# Patient Record
Sex: Female | Born: 1965
Health system: Southern US, Community
[De-identification: ages and names within clinical notes are randomized; demographics above are authoritative.]

## PROBLEM LIST (undated history)

## (undated) DIAGNOSIS — F909 Attention-deficit hyperactivity disorder, unspecified type: Secondary | ICD-10-CM

## (undated) DIAGNOSIS — K219 Gastro-esophageal reflux disease without esophagitis: Secondary | ICD-10-CM

## (undated) DIAGNOSIS — I10 Essential (primary) hypertension: Secondary | ICD-10-CM

## (undated) DIAGNOSIS — T7840XA Allergy, unspecified, initial encounter: Secondary | ICD-10-CM

## (undated) DIAGNOSIS — R232 Flushing: Secondary | ICD-10-CM

## (undated) DIAGNOSIS — E782 Mixed hyperlipidemia: Secondary | ICD-10-CM

## (undated) DIAGNOSIS — F419 Anxiety disorder, unspecified: Secondary | ICD-10-CM

## (undated) HISTORY — PX: BREAST ENHANCEMENT SURGERY: SHX7

## (undated) HISTORY — DX: Mixed hyperlipidemia: E78.2

## (undated) HISTORY — DX: Gastro-esophageal reflux disease without esophagitis: K21.9

## (undated) HISTORY — DX: Anxiety disorder, unspecified: F41.9

## (undated) HISTORY — DX: Essential (primary) hypertension: I10

## (undated) HISTORY — DX: Flushing: R23.2

## (undated) HISTORY — PX: COSMETIC SURGERY: SHX468

## (undated) HISTORY — DX: Allergy, unspecified, initial encounter: T78.40XA

## (undated) HISTORY — DX: Attention-deficit hyperactivity disorder, unspecified type: F90.9

---

## 1995-06-18 HISTORY — PX: AUGMENTATION MAMMAPLASTY: SUR837

## 2010-07-04 ENCOUNTER — Ambulatory Visit: Payer: Self-pay | Admitting: Oncology

## 2010-07-18 ENCOUNTER — Ambulatory Visit: Payer: Self-pay | Admitting: Oncology

## 2010-09-04 ENCOUNTER — Ambulatory Visit: Payer: Self-pay | Admitting: Oncology

## 2010-09-16 ENCOUNTER — Ambulatory Visit: Payer: Self-pay | Admitting: Oncology

## 2015-06-27 LAB — HM PAP SMEAR: HM Pap smear: NEGATIVE

## 2016-01-23 ENCOUNTER — Encounter: Payer: Self-pay | Admitting: Obstetrics and Gynecology

## 2016-01-23 ENCOUNTER — Ambulatory Visit (INDEPENDENT_AMBULATORY_CARE_PROVIDER_SITE_OTHER): Payer: BLUE CROSS/BLUE SHIELD | Admitting: Obstetrics and Gynecology

## 2016-01-23 VITALS — BP 142/87 | HR 76 | Ht 65.0 in | Wt 177.7 lb

## 2016-01-23 DIAGNOSIS — N951 Menopausal and female climacteric states: Secondary | ICD-10-CM | POA: Diagnosis not present

## 2016-01-23 DIAGNOSIS — R635 Abnormal weight gain: Secondary | ICD-10-CM | POA: Diagnosis not present

## 2016-01-23 NOTE — Progress Notes (Signed)
GYNECOLOGY CLINIC PROGRESS NOTE  Subjective:    Kathleen Dennis is a 50 y.o. G1P0010 postmenopausal female (x 1 year) who presents to discuss management options for vasomotor symptoms. Patient is requesting possible hormone replacement therapy due to hot flashes, moodiness, weight gain (30 lbs in the past year) and no energy. The patient is not taking hormone replacement therapy. Patient denies post-menopausal vaginal bleeding. The patient is sexually active. GYN screening history: last pap: approximate date 06/2015 and was normal. Patient is hormone deficient due to menopause, which occurred at age 89.    Current hormone therapy:  none  Previous hormone therapy:  None. Has tried OTC supplements such as Estroven, black cohosh,  weight management pills, Cool Sleep with only minimal to modest results.   Menstrual History: OB History    Gravida Para Term Preterm AB Living   1       1     SAB TAB Ectopic Multiple Live Births   1              Menarche age: 53 Patient's last menstrual period was 06/17/2013 (approximate).    Past Medical History:  Diagnosis Date  . Anxiety   . Hypertension     Family History  Problem Relation Age of Onset  . Thyroid disease Mother   . Diabetes Father   . Hypertension Father   . Heart disease Maternal Grandmother   . Heart disease Maternal Grandfather     Past Surgical History:  Procedure Laterality Date  . BREAST ENHANCEMENT SURGERY      Social History   Social History  . Marital status: Single    Spouse name: N/A  . Number of children: N/A  . Years of education: N/A   Occupational History  . Not on file.   Social History Main Topics  . Smoking status: Light Tobacco Smoker    Years: 5.00    Types: Cigarettes  . Smokeless tobacco: Never Used  . Alcohol use Yes     Comment: socially  . Drug use:   . Sexual activity: Yes    Birth control/ protection: Post-menopausal   Other Topics Concern  . Not on file   Social  History Narrative  . No narrative on file    No current outpatient prescriptions on file prior to visit.   No current facility-administered medications on file prior to visit.     Allergies  Allergen Reactions  . Sulfa Antibiotics Hives     Review of Systems Pertinent items noted in HPI and remainder of comprehensive ROS otherwise negative.    Objective:     BP (!) 142/87   Pulse 76   Ht  (1.651 m)   Wt 177 lb 11.2 oz (80.6 kg)   LMP 06/17/2013 (Approximate)   BMI 29.57 kg/m  General appearance: alert and no distress Neck: no adenopathy, no carotid bruit, no JVD, supple, symmetrical, trachea midline and thyroid not enlarged, symmetric, no tenderness/mass/nodules Lungs: clear to auscultation bilaterally Heart: regular rate and rhythm, S1, S2 normal, no murmur, click, rub or gallop Abdomen: soft, non-tender; bowel sounds normal; no masses,  no organomegaly Pelvic: deferred  Extremities: extremities normal, atraumatic, no cyanosis or edema   Assessment:   Menopausal syndrome in 51 y.o. woman.   Weight gain   Plan:   - Patient with bothersome menopausal vasomotor symptoms. Discussed lifestyle interventions such as wearing light clothing, remaining in cool environments, having fan/air conditioner in the room, avoiding hot beverages etc.  Discussed using hormone therapy and concerns about increased risk of heart disease, cerebrovascular disease, thromboembolic disease,  and breast cancer.  Also discussed other medical options such as Brisdelle/Paxil, Effexor or Neurontin.  Patient opted for non-hormonal therapy with Brisdelle. Samples given (2 week supply).  Patient to f/u in office after samples completed if prescription desired, or to discuss alternative treatments agin.    - Patient desires to discuss weight management.  Notes that she is exercising and trying to modify diet with no significant results. Inquires about prescription weight loss medications.  [facility].     Hildred LaserAnika Nichelle Renwick, MD [facility]

## 2016-01-23 NOTE — Patient Instructions (Signed)
Paroxetine capsules What is this medicine? PAROXETINE (pa ROX e teen) is used to treat hot flashes due to menopause. This medicine may be used for other purposes; ask your health care provider or pharmacist if you have questions. What should I tell my health care provider before I take this medicine? They need to know if you have any of these conditions: -bleeding disorders -glaucoma -heart disease -kidney disease -liver disease -low levels of sodium in the blood -mania or bipolar disorder -seizures -suicidal thoughts, plans, or attempt; a previous suicide attempt by you or a family member -take MAOIs like Carbex, Eldepryl, Marplan, Nardil, and Parnate -take medicines that treat or prevent blood clots -an unusual or allergic reaction to paroxetine, other medicines, foods, dyes, or preservatives -pregnant or trying to get pregnant -breast-feeding How should I use this medicine? Take this medicine by mouth once daily at bedtime. Follow the directions on the prescription label. This medicine can be taken with or without food. Take your medicine at regular intervals. Do not take your medicine more often than directed. A special MedGuide will be given to you by the pharmacist with each prescription and refill. Be sure to read this information carefully each time. Overdosage: If you think you have taken too much of this medicine contact a poison control center or emergency room at once. NOTE: This medicine is only for you. Do not share this medicine with others. What if I miss a dose? If you miss a dose, take it as soon as you can. If it is almost time for your next dose, take only that dose. Do not take double or extra doses. What may interact with this medicine? Do not take this medicine with any of the following medications: -linezolid -MAOIs like Carbex, Eldepryl, Marplan, Nardil, and Parnate -methylene blue (injected into a vein) -pimozide -thioridazine This medicine may also  interact with the following medications: -alcohol -aspirin and aspirin-like medicines -atomoxetine -certain medicines for depression, anxiety, or psychotic disturbances -certain medicines for irregular heart beat like propafenone, flecainide, encainide, and quinidine -certain medicines for migraine headache like almotriptan, eletriptan, frovatriptan, naratriptan, rizatriptan, sumatriptan, zolmitriptan -cimetidine -digoxin -diuretics -fentanyl -fosamprenavir -furazolidone -isoniazid -lithium -medicines that treat or prevent blood clots like warfarin, enoxaparin, and dalteparin -medicines for sleep -NSAIDs, medicines for pain and inflammation, like ibuprofen or naproxen -phenobarbital -phenytoin -procarbazine -rasagiline -ritonavir -supplements like St. John's wort, kava kava, valerian -tamoxifen -tramadol -tryptophan This list may not describe all possible interactions. Give your health care provider a list of all the medicines, herbs, non-prescription drugs, or dietary supplements you use. Also tell them if you smoke, drink alcohol, or use illegal drugs. Some items may interact with your medicine. What should I watch for while using this medicine? Tell your doctor or healthcare professional if your symptoms do not start to get better or if they get worse. Visit your doctor or health care professional for regular checks on your progress. Patients and their families should watch out for new or worsening thoughts of suicide or depression. Also watch out for sudden changes in feelings such as feeling anxious, agitated, panicky, irritable, hostile, aggressive, impulsive, severely restless, overly excited and hyperactive, or not being able to sleep. If this happens, especially at the beginning of treatment or after a change in dose, call your health care professional. You may get drowsy or dizzy. Do not drive, use machinery, or do anything that needs mental alertness until you know how this  medicine affects you. Do not stand or sit up   quickly, especially if you are an older patient. This reduces the risk of dizzy or fainting spells. Alcohol may interfere with the effect of this medicine. Avoid alcoholic drinks. Your mouth may get dry. Chewing sugarless gum, sucking hard candy and drinking plenty of water will help. Contact your doctor if the problem does not go away or is severe. Women should inform their doctor if they wish to become pregnant or think they might be pregnant. There is a potential for serious side effects to an unborn child. Talk to your health care professional or pharmacist for more information. Do not become pregnant while taking this medicine. What side effects may I notice from receiving this medicine? Side effects that you should report to your doctor or health care professional as soon as possible: -allergic reactions like skin rash, itching or hives, swelling of the face, lips, or tongue -changes in emotions or moods -confusion -depression -feeling faint or lightheaded, falls -seizures -suicidal thoughts or actions -unusual bleeding or bruising -unusually weak or tired -weakness Side effects that usually do not require medical attention (Report these to your doctor or health care professional if they continue or are bothersome.): -change in sex drive or performance -fatigue -drowsiness -headache -insomnia -nausea/vomiting -upset stomach This list may not describe all possible side effects. Call your doctor for medical advice about side effects. You may report side effects to FDA at 1-800-FDA-1088. Where should I keep my medicine? Keep out of the reach of children. Store at room temperature between 20 and 25 degrees C (68 and 77 degrees F). Throw away any unused medicine after the expiration date. NOTE: This sheet is a summary. It may not cover all possible information. If you have questions about this medicine, talk to your doctor, pharmacist, or  health care provider.    2016, Elsevier/Gold Standard. (2012-02-03 12:26:17)  

## 2016-02-08 ENCOUNTER — Encounter: Payer: Self-pay | Admitting: Obstetrics and Gynecology

## 2016-02-08 ENCOUNTER — Ambulatory Visit (INDEPENDENT_AMBULATORY_CARE_PROVIDER_SITE_OTHER): Payer: BLUE CROSS/BLUE SHIELD | Admitting: Obstetrics and Gynecology

## 2016-02-08 VITALS — BP 158/84 | HR 86 | Wt 179.2 lb

## 2016-02-08 DIAGNOSIS — I10 Essential (primary) hypertension: Secondary | ICD-10-CM | POA: Diagnosis not present

## 2016-02-08 DIAGNOSIS — N951 Menopausal and female climacteric states: Secondary | ICD-10-CM | POA: Diagnosis not present

## 2016-02-08 MED ORDER — PAROXETINE HCL 10 MG PO TABS: 10.0000 mg | ORAL_TABLET | Freq: Every day | ORAL | 11 refills | Status: DC

## 2016-02-11 NOTE — Progress Notes (Signed)
    GYNECOLOGY PROGRESS NOTE  Subjective:    Patient ID: Kathleen Dennis, female    DOB: 08/21/1965, 50 y.o.   MRN: [id number]  HPI  Patient is a 50 y.o. 91P0010 female who presents for 2 week f/u of menopausal vasomotor symptoms.  Patient took samples of Brisdelle, noted that most of her symptoms improved significantly.  Still noting occasional mood changes and hot flushes, but overall pleased with medication.  Wonders if dose can be increased.   The following portions of the patient's history were reviewed and updated as appropriate: allergies, current medications, past family history, past medical history, past social history, past surgical history and problem list.  Review of Systems Pertinent items noted in HPI and remainder of comprehensive ROS otherwise negative.   Objective:   Blood pressure (!) 158/84, pulse 86, weight 179 lb 3.2 oz (81.3 kg), last menstrual period 06/17/2013. General appearance: alert and no distress Exam deferred.    Assessment:   Menopausal vasomotor symptoms HTN (uncontrolled)  Plan:   - Menopausal symptoms - patient noted that Brisdelle did help but wonders if dose could be increased slightly. Will prescribe Paxil 10 mg, and can titrate up as needed.  - HTN - Patient notes that BPs are usually not this high.  Advised on taking BPs at home and recording values.  Also notes that she has an appointment with Cardiology (scheduled by PCP).   - To f/u as needed.   Hildred LaserAnika Deagen Krass, MD [facility]

## 2016-02-22 ENCOUNTER — Ambulatory Visit: Payer: BLUE CROSS/BLUE SHIELD | Admitting: Obstetrics and Gynecology

## 2016-03-01 ENCOUNTER — Encounter: Payer: Self-pay | Admitting: Obstetrics and Gynecology

## 2016-03-01 ENCOUNTER — Ambulatory Visit (INDEPENDENT_AMBULATORY_CARE_PROVIDER_SITE_OTHER): Payer: BLUE CROSS/BLUE SHIELD | Admitting: Obstetrics and Gynecology

## 2016-03-01 VITALS — BP 138/84 | HR 77 | Wt 177.3 lb

## 2016-03-01 DIAGNOSIS — E669 Obesity, unspecified: Secondary | ICD-10-CM

## 2016-03-01 MED ORDER — CYANOCOBALAMIN 1000 MCG/ML IJ SOLN: 1000.0000 ug | Freq: Once | INTRAMUSCULAR | Status: DC

## 2016-03-01 MED ORDER — PHENTERMINE HCL 37.5 MG PO TABS: 37.5000 mg | ORAL_TABLET | Freq: Every day | ORAL | 2 refills | Status: DC

## 2016-03-01 MED ORDER — CYANOCOBALAMIN 1000 MCG/ML IJ SOLN: 1000.0000 ug | INTRAMUSCULAR | Status: DC

## 2016-03-01 NOTE — Progress Notes (Signed)
Pt is here for weight loss program, is exercising and really trying to lose weight.  She desires to start Phentermine 37.5mg , discussed with pt side effect and how to take medication

## 2016-03-27 ENCOUNTER — Telehealth: Payer: Self-pay | Admitting: Obstetrics and Gynecology

## 2016-03-27 NOTE — Telephone Encounter (Signed)
Please have pt scheduled for a nurse visit for UTI or Dr.Cherry's schedule if she has an availability. Thanks

## 2016-03-27 NOTE — Telephone Encounter (Signed)
PT CALLED AND SHE IS PRETTY SURE THAT SHE HAS A UTI, URGENCY TO URINATE BUT NOTHING COME, BURNING, SHE DID SEE SOME BLOOD IN URINE YESTERDAY, WORSE YESTERDAY, DID GET OTC YESTERDAY TO TRY AND HELP BUT SHE IS AFRAID THAT ITS NOT GOING TO KNOCK IT OUT SINCE SHE SAW THE BLOOD IN URINE.

## 2016-03-27 NOTE — Telephone Encounter (Signed)
PT COMING IN TOMORROW MORNING TO DROP OFF URINE SAMPLE, I PUT HER ON THE LAB SCHEDULE

## 2016-03-28 ENCOUNTER — Other Ambulatory Visit (INDEPENDENT_AMBULATORY_CARE_PROVIDER_SITE_OTHER): Payer: BLUE CROSS/BLUE SHIELD

## 2016-03-28 ENCOUNTER — Telehealth: Payer: Self-pay | Admitting: Obstetrics and Gynecology

## 2016-03-28 DIAGNOSIS — R399 Unspecified symptoms and signs involving the genitourinary system: Secondary | ICD-10-CM | POA: Diagnosis not present

## 2016-03-28 LAB — POCT URINALYSIS DIPSTICK
BILIRUBIN UA: NEGATIVE
Glucose, UA: NEGATIVE
Ketones, UA: NEGATIVE
Leukocytes, UA: NEGATIVE
NITRITE UA: NEGATIVE
PH UA: 6.5
PROTEIN UA: NEGATIVE
RBC UA: NEGATIVE
Spec Grav, UA: 1.025
UROBILINOGEN UA: NEGATIVE

## 2016-03-28 NOTE — Telephone Encounter (Signed)
PT CALLED AND WANTED TO KNOW BY ANY CHANCE IF SOMETHING SHOWED UP ON THE DIPSTICK FROM THIS MORNING WHEN SHE DROPPED HER URINE OFF, I DID TELL HER THAT ALL URINE IS SENT OUT FOR CULTURE BUT I WOULD ASK YOU IF HER URINE WAS DIPPED.

## 2016-03-28 NOTE — Progress Notes (Signed)
Patient ID: Kathleen Dennis, female   DOB: 12-May-1966, 50 y.o.   MRN: [id number]  Urinalysis with in normal limits. Dr. Valentino Saxonherry notified. Will send urine culture before ordering any medication.

## 2016-03-28 NOTE — Telephone Encounter (Signed)
Left message to check my chart.

## 2016-03-29 ENCOUNTER — Ambulatory Visit: Payer: BLUE CROSS/BLUE SHIELD

## 2016-03-29 ENCOUNTER — Ambulatory Visit (INDEPENDENT_AMBULATORY_CARE_PROVIDER_SITE_OTHER): Payer: BLUE CROSS/BLUE SHIELD | Admitting: Obstetrics and Gynecology

## 2016-03-29 VITALS — BP 130/97 | HR 82 | Wt 175.6 lb

## 2016-03-29 DIAGNOSIS — R635 Abnormal weight gain: Secondary | ICD-10-CM | POA: Diagnosis not present

## 2016-03-29 DIAGNOSIS — R399 Unspecified symptoms and signs involving the genitourinary system: Secondary | ICD-10-CM | POA: Diagnosis not present

## 2016-03-29 MED ORDER — CYANOCOBALAMIN 1000 MCG/ML IJ SOLN
1000.0000 ug | Freq: Once | INTRAMUSCULAR | Status: AC
  Administered 2016-03-29: 1000 ug via INTRAMUSCULAR

## 2016-03-29 MED ORDER — CYANOCOBALAMIN 1000 MCG/ML IJ SOLN: 1000.0000 ug | Freq: Once | INTRAMUSCULAR | 1 refills | Status: DC

## 2016-03-29 NOTE — Progress Notes (Signed)
Patient ID: Kathleen Dennis, female   DOB: 1966-02-27, 50 y.o.   MRN: [id number]  03/29/2016  Pt presents for weight, B/P, B-12 injection. No side effects of medication-Phentermine, or B-12.  Weight loss of _2_ lbs. Encouraged eating healthy and exercise.

## 2016-04-01 ENCOUNTER — Other Ambulatory Visit: Payer: Self-pay | Admitting: Obstetrics and Gynecology

## 2016-04-01 LAB — URINE CULTURE

## 2016-04-01 MED ORDER — CIPROFLOXACIN HCL 500 MG PO TABS: 500.0000 mg | ORAL_TABLET | Freq: Two times a day (BID) | ORAL | 0 refills | Status: DC

## 2016-04-11 ENCOUNTER — Telehealth: Payer: Self-pay

## 2016-04-11 NOTE — Telephone Encounter (Signed)
I called pt due to receiving email that pt had not check her message from 03/28/16 regarding her urinalysis results and when I checked her labs for her [facility] results they were positive for ecoli and ATB was sent to pharmacy by Dr. Valentino Saxonherry but pt had not seen this either. Pt states that she does not check her my chart but I encouraged her strongly to do so in the future. She states she went to the pharmacy for another prescription and the ATB was there and she get it and took it. She said she would have definitely have called if she did not get better. Pt appreciative of call.

## 2016-04-26 ENCOUNTER — Ambulatory Visit (INDEPENDENT_AMBULATORY_CARE_PROVIDER_SITE_OTHER): Payer: BLUE CROSS/BLUE SHIELD | Admitting: Obstetrics and Gynecology

## 2016-04-26 VITALS — BP 139/96 | HR 86 | Ht 65.0 in | Wt 174.5 lb

## 2016-04-26 DIAGNOSIS — E669 Obesity, unspecified: Secondary | ICD-10-CM | POA: Diagnosis not present

## 2016-04-26 MED ORDER — CYANOCOBALAMIN 1000 MCG/ML IJ SOLN
1000.0000 ug | Freq: Once | INTRAMUSCULAR | Status: AC
  Administered 2016-04-26: 1000 ug via INTRAMUSCULAR

## 2016-04-26 NOTE — Progress Notes (Signed)
Patient ID: Kathleen Dennis, female   DOB: 11/14/1965, 50 y.o.   MRN: [id number] 04/26/2016  Pt presents for weight, B/P, B-12 injection. No side effects of medication-Phentermine, or B-12.  Weight loss of _1_ lbs. Encouraged eating healthy and exercise. B/P 139/96. No c/o symptoms of elevated B/P. Pt takes her B/P at home and states it is normal.

## 2016-05-13 ENCOUNTER — Other Ambulatory Visit: Payer: Self-pay | Admitting: *Deleted

## 2016-05-15 DIAGNOSIS — J069 Acute upper respiratory infection, unspecified: Secondary | ICD-10-CM | POA: Diagnosis not present

## 2016-05-29 ENCOUNTER — Other Ambulatory Visit: Payer: Self-pay | Admitting: Obstetrics and Gynecology

## 2016-05-30 ENCOUNTER — Ambulatory Visit (INDEPENDENT_AMBULATORY_CARE_PROVIDER_SITE_OTHER): Payer: BLUE CROSS/BLUE SHIELD | Admitting: Obstetrics and Gynecology

## 2016-05-30 ENCOUNTER — Encounter: Payer: Self-pay | Admitting: Obstetrics and Gynecology

## 2016-05-30 VITALS — BP 139/89 | HR 89 | Wt 175.3 lb

## 2016-05-30 DIAGNOSIS — E663 Overweight: Secondary | ICD-10-CM | POA: Diagnosis not present

## 2016-05-30 DIAGNOSIS — Z23 Encounter for immunization: Secondary | ICD-10-CM

## 2016-05-30 MED ORDER — PHENTERMINE HCL 37.5 MG PO TABS: 37.5000 mg | ORAL_TABLET | Freq: Every day | ORAL | 2 refills | Status: DC

## 2016-05-30 MED ORDER — CYANOCOBALAMIN 1000 MCG/ML IJ SOLN: 1000.0000 ug | INTRAMUSCULAR | 1 refills | Status: DC

## 2016-05-30 NOTE — Progress Notes (Signed)
SUBJECTIVE:  50 y.o. here for follow-up weight loss visit, previously seen 4 weeks ago. Denies any concerns and feels like medication is working well. Desires continuing. Also needs flu vaccine  OBJECTIVE:  BP 139/89   Pulse 89   Wt 175 lb 4.8 oz (79.5 kg)   LMP 06/17/2013 (Approximate)   BMI 29.17 kg/m   Body mass index is 29.17 kg/m. Patient appears well.  ASSESSMENT:  Overweight- responding well to weight loss plan Needs flu vaccine  PLAN:  To continue with current medications. B12 106400mcg/ml injection given RTC in 4 weeks as planned Flu vaccine given   Harlow MaresMelody Shambley, CNM

## 2016-06-28 ENCOUNTER — Other Ambulatory Visit: Payer: Self-pay | Admitting: *Deleted

## 2016-06-28 NOTE — Telephone Encounter (Signed)
New note done

## 2016-07-02 NOTE — Progress Notes (Signed)
ANNUAL PREVENTATIVE CARE GYNECOLOGY  ENCOUNTER NOTE  Subjective:       Kathleen Dennis is a 51 y.o. G61P0010 female here for a routine annual gynecologic exam. The patient is sexually active. The patient is not taking hormone replacement therapy. Patient denies post-menopausal vaginal bleeding. The patient wears seatbelts: yes. The patient participates in regular exercise: no. The patient reports that there is not domestic violence in her life.  Current complaints: 1.  None    Gynecologic History Patient's last menstrual period was 06/17/2013 (approximate). Contraception: post menopausal status Last Pap: 06/2015 (performed in [facility]). Results were: normal Last mammogram: ~ 10/2015. Results were: normal   Obstetric History OB History  Gravida Para Term Preterm AB Living  1       1    SAB TAB Ectopic Multiple Live Births  1            # Outcome Date GA Lbr Len/2nd Weight Sex Delivery Anes PTL Lv  1 SAB 1989              Past Medical History:  Diagnosis Date  . Anxiety   . Hypertension     Family History  Problem Relation Age of Onset  . Thyroid disease Mother   . Diabetes Father   . Hypertension Father   . Heart disease Maternal Grandmother   . Heart disease Maternal Grandfather     Past Surgical History:  Procedure Laterality Date  . BREAST ENHANCEMENT SURGERY      Social History   Social History  . Marital status: Single    Spouse name: N/A  . Number of children: N/A  . Years of education: N/A   Occupational History  . Not on file.   Social History Main Topics  . Smoking status: Light Tobacco Smoker    Years: 5.00    Types: Cigarettes  . Smokeless tobacco: Never Used  . Alcohol use Yes     Comment: socially  . Drug use:   . Sexual activity: Yes    Birth control/ protection: Post-menopausal   Other Topics Concern  . Not on file   Social History Narrative  . No narrative on file    Current Outpatient Prescriptions on File Prior  to Visit  Medication Sig Dispense Refill  . ALPRAZolam (XANAX) 0.25 MG tablet Take by mouth.    . calcium carbonate (CALCIUM 600) 600 MG TABS tablet Take by mouth.    . cetirizine (ZYRTEC) 10 MG tablet Take by mouth.    . ciprofloxacin (CIPRO) 500 MG tablet Take 1 tablet (500 mg total) by mouth 2 (two) times daily. (Patient not taking: Reported on 05/30/2016) 6 tablet 0  . cyanocobalamin (,VITAMIN B-12,) 1000 MCG/ML injection INJECT 1mL INTO THE MUSCLE ONCE 10 mL 1  . cyanocobalamin (,VITAMIN B-12,) 1000 MCG/ML injection Inject 1 mL (1,000 mcg total) into the muscle every 30 (thirty) days. 10 mL 1  . lansoprazole (PREVACID) 15 MG capsule Take by mouth.    . losartan-hydrochlorothiazide (HYZAAR) 100-12.5 MG tablet TAKE ONE TABLET BY MOUTH EVERY DAY    . PARoxetine (PAXIL) 10 MG tablet Take 1 tablet (10 mg total) by mouth daily. 30 tablet 11  . phentermine (ADIPEX-P) 37.5 MG tablet Take 1 tablet (37.5 mg total) by mouth daily before breakfast. Add another at noon 60 tablet 2  . pyridoxine (B-6) 100 MG tablet Take by mouth.     Current Facility-Administered Medications on File Prior to Visit  Medication Dose Route Frequency  Provider Last Rate Last Dose  . cyanocobalamin ((VITAMIN B-12)) injection 1,000 mcg  1,000 mcg Intramuscular Once [employer]Melody N Shambley, CNM      . cyanocobalamin ((VITAMIN B-12)) injection 1,000 mcg  1,000 mcg Intramuscular Q30 days Melody Suzan NailerN Shambley, CNM        Allergies  Allergen Reactions  . Sulfa Antibiotics Hives      Review of Systems ROS Review of Systems - General ROS: negative for - chills, fatigue, fever, hot flashes, night sweats, weight gain or weight loss Psychological ROS: negative for - anxiety, decreased libido, depression, mood swings, physical abuse or sexual abuse Ophthalmic ROS: negative for - blurry vision, eye pain or loss of vision ENT ROS: negative for - headaches, hearing change, visual changes or vocal changes Allergy and Immunology ROS:  negative for - hives, itchy/watery eyes or seasonal allergies Hematological and Lymphatic ROS: negative for - bleeding problems, bruising, swollen lymph nodes or weight loss Endocrine ROS: negative for - galactorrhea, hair pattern changes, hot flashes, malaise/lethargy, mood swings, palpitations, polydipsia/polyuria, skin changes, temperature intolerance or unexpected weight changes Breast ROS: negative for - new or changing breast lumps or nipple discharge Respiratory ROS: negative for - cough or shortness of breath Cardiovascular ROS: negative for - chest pain, irregular heartbeat, palpitations or shortness of breath Gastrointestinal ROS: no abdominal pain, change in bowel habits, or black or bloody stools Genito-Urinary ROS: no dysuria, trouble voiding, or hematuria Musculoskeletal ROS: negative for - joint pain or joint stiffness Neurological ROS: negative for - bowel and bladder control changes Dermatological ROS: negative for rash and skin lesion changes   Objective:   BP 127/88 (BP Location: Left Arm, Patient Position: Sitting, Cuff Size: Normal)   Pulse 97   Ht 5\' 5"  (1.651 m)   Wt 171 lb (77.6 kg)   LMP 06/17/2013 (Approximate)   BMI 28.46 kg/m    CONSTITUTIONAL: Well-developed, well-nourished female in no acute distress. Overweight.  PSYCHIATRIC: Normal mood and affect. Normal behavior. Normal judgment and thought content. NEUROLGIC: Alert and oriented to person, place, and time. Normal muscle tone coordination. No cranial nerve deficit noted. HENT:  Normocephalic, atraumatic, External right and left ear normal. Oropharynx is clear and moist EYES: Conjunctivae and EOM are normal. Pupils are equal, round, and reactive to light. No scleral icterus.  NECK: Normal range of motion, supple, no masses.  Normal thyroid.  SKIN: Skin is warm and dry. No rash noted. Not diaphoretic. No erythema. No pallor. CARDIOVASCULAR: Normal heart rate noted, regular rhythm, no murmur. RESPIRATORY:  Clear to auscultation bilaterally. Effort and breath sounds normal, no problems with respiration noted. BREASTS: Symmetric in size. No masses, skin changes, nipple drainage, or lymphadenopathy. Implants palpable.  ABDOMEN: Soft, normal bowel sounds, no distention noted.  No tenderness, rebound or guarding.  BLADDER: Normal PELVIC:  Bladder no bladder distension noted  Urethra: normal appearing urethra with no masses, tenderness or lesions  Vulva: normal appearing vulva with no masses, tenderness or lesions  Vagina: normal appearing vagina with normal color and discharge, no lesions and atrophic  Cervix: normal appearing cervix without discharge or lesions  Uterus: uterus is normal size, shape, consistency and nontender  Adnexa: normal adnexa in size, nontender and no masses  RV: External Exam NormaI, No Rectal Masses and Normal Sphincter tone  MUSCULOSKELETAL: Normal range of motion. No tenderness.  No cyanosis, clubbing, or edema.  2+ distal pulses. LYMPHATIC: No Axillary, Supraclavicular, or Inguinal Adenopathy.    Assessment:   Annual gynecologic examination 51 y.o., postmenopausal  Contraception: post menopausal status Overweight  Tobacco abuse CHTN  Plan:  Pap: Not needed, up to date.  Mammogram: Ordered Stool Guaiac Testing:  Not Indicated Labs: CBC, CMP, Lipid 1 and TSH, and Vitamin D level.  Routine preventative health maintenance measures emphasized: Exercise/Diet/Weight control, Tobacco Warnings, Alcohol/Substance use risks and Stress Management  To continue Paxil for menopausal vasomotor symptoms.   CHTN, currently controlled on meds. Does not currently have a PCP but was seen by Cardiologist within the past year. As long as it is controlled, will continue to manage, otherwise, patient will need referral to PCP.  Return to Clinic - 1 Year   Hildred Laser, MD [facility]

## 2016-07-16 ENCOUNTER — Encounter: Payer: Self-pay | Admitting: Obstetrics and Gynecology

## 2016-07-16 ENCOUNTER — Ambulatory Visit (INDEPENDENT_AMBULATORY_CARE_PROVIDER_SITE_OTHER): Payer: BLUE CROSS/BLUE SHIELD | Admitting: Obstetrics and Gynecology

## 2016-07-16 VITALS — BP 127/88 | HR 97 | Ht 65.0 in | Wt 171.0 lb

## 2016-07-16 DIAGNOSIS — E663 Overweight: Secondary | ICD-10-CM | POA: Diagnosis not present

## 2016-07-16 DIAGNOSIS — F909 Attention-deficit hyperactivity disorder, unspecified type: Secondary | ICD-10-CM | POA: Insufficient documentation

## 2016-07-16 DIAGNOSIS — I10 Essential (primary) hypertension: Secondary | ICD-10-CM | POA: Diagnosis not present

## 2016-07-16 DIAGNOSIS — Z1239 Encounter for other screening for malignant neoplasm of breast: Secondary | ICD-10-CM

## 2016-07-16 DIAGNOSIS — Z1231 Encounter for screening mammogram for malignant neoplasm of breast: Secondary | ICD-10-CM | POA: Diagnosis not present

## 2016-07-16 DIAGNOSIS — Z13220 Encounter for screening for lipoid disorders: Secondary | ICD-10-CM

## 2016-07-16 DIAGNOSIS — T7840XA Allergy, unspecified, initial encounter: Secondary | ICD-10-CM | POA: Insufficient documentation

## 2016-07-16 DIAGNOSIS — Z01419 Encounter for gynecological examination (general) (routine) without abnormal findings: Secondary | ICD-10-CM | POA: Diagnosis not present

## 2016-07-16 DIAGNOSIS — Z720 Tobacco use: Secondary | ICD-10-CM | POA: Diagnosis not present

## 2016-07-16 DIAGNOSIS — K219 Gastro-esophageal reflux disease without esophagitis: Secondary | ICD-10-CM | POA: Insufficient documentation

## 2016-07-16 HISTORY — DX: Gastro-esophageal reflux disease without esophagitis: K21.9

## 2016-07-16 HISTORY — DX: Attention-deficit hyperactivity disorder, unspecified type: F90.9

## 2016-07-17 LAB — CBC
HEMATOCRIT: 42.5 % (ref 34.0–46.6)
Hemoglobin: 14.3 g/dL (ref 11.1–15.9)
MCH: 34.5 pg — ABNORMAL HIGH (ref 26.6–33.0)
MCHC: 33.6 g/dL (ref 31.5–35.7)
MCV: 102 fL — AB (ref 79–97)
Platelets: 185 10*3/uL (ref 150–379)
RBC: 4.15 x10E6/uL (ref 3.77–5.28)
RDW: 13.3 % (ref 12.3–15.4)
WBC: 5.6 10*3/uL (ref 3.4–10.8)

## 2016-07-17 LAB — COMPREHENSIVE METABOLIC PANEL
ALT: 64 IU/L — AB (ref 0–32)
AST: 88 IU/L — AB (ref 0–40)
Albumin/Globulin Ratio: 1.6 (ref 1.2–2.2)
Albumin: 4.2 g/dL (ref 3.5–5.5)
Alkaline Phosphatase: 98 IU/L (ref 39–117)
BUN/Creatinine Ratio: 14 (ref 9–23)
BUN: 10 mg/dL (ref 6–24)
Bilirubin Total: 0.5 mg/dL (ref 0.0–1.2)
CALCIUM: 8.8 mg/dL (ref 8.7–10.2)
CO2: 23 mmol/L (ref 18–29)
CREATININE: 0.71 mg/dL (ref 0.57–1.00)
Chloride: 96 mmol/L (ref 96–106)
GFR, EST AFRICAN AMERICAN: 116 mL/min/{1.73_m2} (ref >59)
GFR, EST NON AFRICAN AMERICAN: 101 mL/min/{1.73_m2} (ref >59)
GLOBULIN, TOTAL: 2.6 g/dL (ref 1.5–4.5)
Glucose: 112 mg/dL — ABNORMAL HIGH (ref 65–99)
Potassium: 4.5 mmol/L (ref 3.5–5.2)
Sodium: 137 mmol/L (ref 134–144)
TOTAL PROTEIN: 6.8 g/dL (ref 6.0–8.5)

## 2016-07-17 LAB — LIPID PANEL
CHOLESTEROL TOTAL: 217 mg/dL — AB (ref 100–199)
Chol/HDL Ratio: 4.7 ratio units — ABNORMAL HIGH (ref 0.0–4.4)
HDL: 46 mg/dL (ref >39)
Triglycerides: 805 mg/dL (ref 0–149)

## 2016-07-17 LAB — VITAMIN D 25 HYDROXY (VIT D DEFICIENCY, FRACTURES): VIT D 25 HYDROXY: 32.8 ng/mL (ref 30.0–100.0)

## 2016-07-17 LAB — TSH: TSH: 3.13 u[IU]/mL (ref 0.450–4.500)

## 2016-07-18 ENCOUNTER — Encounter: Payer: Self-pay | Admitting: Obstetrics and Gynecology

## 2016-07-23 ENCOUNTER — Telehealth: Payer: Self-pay

## 2016-07-23 ENCOUNTER — Ambulatory Visit (INDEPENDENT_AMBULATORY_CARE_PROVIDER_SITE_OTHER): Payer: BLUE CROSS/BLUE SHIELD | Admitting: Obstetrics and Gynecology

## 2016-07-23 VITALS — BP 122/84 | HR 98 | Wt 168.4 lb

## 2016-07-23 DIAGNOSIS — E663 Overweight: Secondary | ICD-10-CM | POA: Diagnosis not present

## 2016-07-23 DIAGNOSIS — E782 Mixed hyperlipidemia: Secondary | ICD-10-CM

## 2016-07-23 MED ORDER — CYANOCOBALAMIN 1000 MCG/ML IJ SOLN
1000.0000 ug | Freq: Once | INTRAMUSCULAR | Status: AC
  Administered 2016-07-23: 1000 ug via INTRAMUSCULAR

## 2016-07-23 NOTE — Telephone Encounter (Signed)
-----   Message from Hildred LaserAnika Cherry, MD sent at 07/22/2016  4:07 PM EST ----- Please inform patient of labs: abnormal:  1) her cholesterol levels were elevated, most significantly were her Triglyceride levels.  If she does not have a PCP, I would recommend that she find one very soon to manage.  2) Liver enzymes were mildly elevated.  If she has never had any liver problems, I would recommend her repeating this in 4-6 weeks.  3) Her glucose levels were elevated, may be evidence of pre-diabetes or diabetes if she was fasting.  Would recommend having her HgbA1c checked.  She can either have this done at our office, or wait until seen by PCP.  4)  Her MCV was mildly elevated, meaning that she is not getting enough folate and B12 in her diet.  Would encourage more green leafy vegetables, or can take an OTC supplement (like a multivitamin).

## 2016-07-23 NOTE — Telephone Encounter (Signed)
Called pt no answer, unable to leave message as voicemail is full.  

## 2016-07-23 NOTE — Progress Notes (Signed)
Pt is here for wt, bp check, b-12 inj She is doing well, denies any s/e Has started exercising  05/30/16 175lb 04/26/16- 174lb

## 2016-07-24 NOTE — Telephone Encounter (Signed)
Called pt, no answer. Unable to leave message as voicemail is full.  

## 2016-07-26 NOTE — Telephone Encounter (Signed)
Called pt informed her of the information below. Pt gave verbal understanding. Will place urgent referral for pt as she states that she does not have one.

## 2016-07-29 ENCOUNTER — Other Ambulatory Visit: Payer: Self-pay

## 2016-07-29 DIAGNOSIS — R748 Abnormal levels of other serum enzymes: Secondary | ICD-10-CM

## 2016-08-15 ENCOUNTER — Ambulatory Visit: Payer: Self-pay | Admitting: Family Medicine

## 2016-08-20 ENCOUNTER — Ambulatory Visit (INDEPENDENT_AMBULATORY_CARE_PROVIDER_SITE_OTHER): Payer: BLUE CROSS/BLUE SHIELD | Admitting: Obstetrics and Gynecology

## 2016-08-20 ENCOUNTER — Ambulatory Visit: Payer: BLUE CROSS/BLUE SHIELD

## 2016-08-20 VITALS — BP 142/86 | HR 99 | Wt 167.9 lb

## 2016-08-20 DIAGNOSIS — E663 Overweight: Secondary | ICD-10-CM

## 2016-08-20 MED ORDER — CYANOCOBALAMIN 1000 MCG/ML IJ SOLN
1000.0000 ug | Freq: Once | INTRAMUSCULAR | Status: AC
  Administered 2016-08-20: 1000 ug via INTRAMUSCULAR

## 2016-08-20 NOTE — Progress Notes (Signed)
Pt is here for wt, bp check, b-12 inj She is doing well, denies any s/e    08/20/16 wt- 167.9lb 07/23/16 wt-168lb 07/16/16 wt- 171lb

## 2016-09-02 ENCOUNTER — Other Ambulatory Visit: Payer: Self-pay

## 2016-09-02 ENCOUNTER — Encounter: Payer: Self-pay | Admitting: Obstetrics and Gynecology

## 2016-09-05 ENCOUNTER — Encounter: Payer: BLUE CROSS/BLUE SHIELD | Admitting: Obstetrics and Gynecology

## 2016-09-10 ENCOUNTER — Encounter: Payer: Self-pay | Admitting: Obstetrics and Gynecology

## 2016-09-12 ENCOUNTER — Other Ambulatory Visit: Payer: BLUE CROSS/BLUE SHIELD

## 2016-09-12 ENCOUNTER — Ambulatory Visit (INDEPENDENT_AMBULATORY_CARE_PROVIDER_SITE_OTHER): Payer: BLUE CROSS/BLUE SHIELD | Admitting: Obstetrics and Gynecology

## 2016-09-12 ENCOUNTER — Encounter: Payer: Self-pay | Admitting: Obstetrics and Gynecology

## 2016-09-12 VITALS — BP 142/84 | HR 82 | Ht 65.0 in | Wt 163.9 lb

## 2016-09-12 DIAGNOSIS — E663 Overweight: Secondary | ICD-10-CM

## 2016-09-12 DIAGNOSIS — R748 Abnormal levels of other serum enzymes: Secondary | ICD-10-CM

## 2016-09-12 MED ORDER — CYANOCOBALAMIN 1000 MCG/ML IJ SOLN: 1000.0000 ug | INTRAMUSCULAR | 1 refills | Status: DC

## 2016-09-12 MED ORDER — PHENTERMINE HCL 37.5 MG PO TABS: 37.5000 mg | ORAL_TABLET | Freq: Every day | ORAL | 2 refills | Status: DC

## 2016-09-12 NOTE — Progress Notes (Signed)
SUBJECTIVE:  51 y.o. here for follow-up weight loss visit, previously seen 4 weeks ago. Denies any concerns and feels like medication is working well and desires to continue, has lost 8#s to date since starting in December. States she is exercising regular. Denies any concerns.  OBJECTIVE:  BP (!) 142/84   Pulse 82   Ht 5\' 5"  (1.651 m)   Wt 163 lb 14.4 oz (74.3 kg)   LMP 06/17/2013 (Approximate)   BMI 27.27 kg/m   Body mass index is 27.27 kg/m. Patient appears well. ASSESSMENT:  Overweight- responding well to weight loss plan PLAN:  To continue with current medications. B12 104300mcg/ml injection given RTC in 4 weeks as planned  Dewan Emond [place], CNM

## 2016-09-13 LAB — BASIC METABOLIC PANEL
BUN / CREAT RATIO: 15 (ref 9–23)
BUN: 10 mg/dL (ref 6–24)
CHLORIDE: 97 mmol/L (ref 96–106)
CO2: 21 mmol/L (ref 18–29)
Calcium: 9.5 mg/dL (ref 8.7–10.2)
Creatinine, Ser: 0.68 mg/dL (ref 0.57–1.00)
GFR calc Af Amer: 120 mL/min/{1.73_m2} (ref >59)
GFR calc non Af Amer: 104 mL/min/{1.73_m2} (ref >59)
GLUCOSE: 81 mg/dL (ref 65–99)
Potassium: 4.1 mmol/L (ref 3.5–5.2)
Sodium: 137 mmol/L (ref 134–144)

## 2016-09-13 LAB — HEPATIC FUNCTION PANEL
ALK PHOS: 94 IU/L (ref 39–117)
ALT: 35 IU/L — ABNORMAL HIGH (ref 0–32)
AST: 38 IU/L (ref 0–40)
Albumin: 4.6 g/dL (ref 3.5–5.5)
BILIRUBIN TOTAL: 0.3 mg/dL (ref 0.0–1.2)
BILIRUBIN, DIRECT: 0.11 mg/dL (ref 0.00–0.40)
Total Protein: 7.2 g/dL (ref 6.0–8.5)

## 2016-09-17 DIAGNOSIS — S32010A Wedge compression fracture of first lumbar vertebra, initial encounter for closed fracture: Secondary | ICD-10-CM | POA: Diagnosis not present

## 2016-09-19 ENCOUNTER — Encounter: Payer: Self-pay | Admitting: Obstetrics and Gynecology

## 2016-09-21 DIAGNOSIS — M545 Low back pain: Secondary | ICD-10-CM | POA: Diagnosis not present

## 2016-09-24 DIAGNOSIS — S32010A Wedge compression fracture of first lumbar vertebra, initial encounter for closed fracture: Secondary | ICD-10-CM | POA: Diagnosis not present

## 2016-10-01 ENCOUNTER — Other Ambulatory Visit: Payer: Self-pay | Admitting: Obstetrics and Gynecology

## 2016-10-10 ENCOUNTER — Ambulatory Visit (INDEPENDENT_AMBULATORY_CARE_PROVIDER_SITE_OTHER): Payer: BLUE CROSS/BLUE SHIELD | Admitting: Obstetrics and Gynecology

## 2016-10-10 VITALS — BP 122/74 | HR 84 | Ht 65.0 in | Wt 158.5 lb

## 2016-10-10 DIAGNOSIS — E663 Overweight: Secondary | ICD-10-CM

## 2016-10-10 DIAGNOSIS — Z720 Tobacco use: Secondary | ICD-10-CM

## 2016-10-10 MED ORDER — CYANOCOBALAMIN 1000 MCG/ML IJ SOLN
1000.0000 ug | Freq: Once | INTRAMUSCULAR | Status: AC
  Administered 2016-10-10: 1000 ug via INTRAMUSCULAR

## 2016-10-10 NOTE — Progress Notes (Signed)
Pt presents for 1 month f/u wt, bp an b12. Weight is down 5#. NO s/e from adipex. Pt is exercising 3-4 times a week. Encouraged to continue exercising and healthy eating. Pt to f/u in 4 weeks.

## 2016-11-05 ENCOUNTER — Ambulatory Visit (INDEPENDENT_AMBULATORY_CARE_PROVIDER_SITE_OTHER): Payer: BLUE CROSS/BLUE SHIELD | Admitting: Obstetrics and Gynecology

## 2016-11-05 VITALS — BP 148/100 | HR 99 | Wt 160.0 lb

## 2016-11-05 DIAGNOSIS — E663 Overweight: Secondary | ICD-10-CM

## 2016-11-05 MED ORDER — CYANOCOBALAMIN 1000 MCG/ML IJ SOLN
1000.0000 ug | Freq: Once | INTRAMUSCULAR | Status: AC
  Administered 2016-11-05: 1000 ug via INTRAMUSCULAR

## 2016-11-05 NOTE — Progress Notes (Signed)
Pt is here for wt, bp check, b-12 inj, she is doing well, Her bp was elevated states she hasnt taken her BP medication today  10/10/16 wt- 158lb 09/12/16 wt- 163lb

## 2016-11-08 ENCOUNTER — Ambulatory Visit: Payer: BLUE CROSS/BLUE SHIELD

## 2016-12-02 ENCOUNTER — Ambulatory Visit (INDEPENDENT_AMBULATORY_CARE_PROVIDER_SITE_OTHER): Payer: BLUE CROSS/BLUE SHIELD | Admitting: Obstetrics and Gynecology

## 2016-12-02 ENCOUNTER — Ambulatory Visit: Payer: BLUE CROSS/BLUE SHIELD

## 2016-12-02 VITALS — BP 130/100 | HR 73 | Ht 65.0 in | Wt 156.0 lb

## 2016-12-02 DIAGNOSIS — E663 Overweight: Secondary | ICD-10-CM | POA: Diagnosis not present

## 2016-12-02 MED ORDER — CYANOCOBALAMIN 1000 MCG/ML IJ SOLN
1000.0000 ug | Freq: Once | INTRAMUSCULAR | Status: AC
  Administered 2016-12-02: 1000 ug via INTRAMUSCULAR

## 2016-12-02 NOTE — Progress Notes (Signed)
Patient ID: Kathleen Dennis, female   DOB: [birth year], 51 y.o.   MRN: [id number] Pt presents for weight, B/P, B-12 injection. No side effects of medication-Phentermine, or B-12.  Weight loss of 4 lbs. Encouraged eating healthy and exercise. Pt B/P elevated 157/99, P-73 with electronic cuff. Taken manually x2. B/P-140/100 (Rt arm), B/P-130/100 (Lt arm). Denies any s/s of HTN such as dizziness, impaired vision, h/a's. Pt states her B/P is normally high when visiting the office and keeps check on this at home. Pt has not taken phentermine due to rx out and needs an appt with [facility] in a week. Pt would have been then 2 wks off phentermine.

## 2016-12-12 ENCOUNTER — Encounter: Payer: Self-pay | Admitting: Obstetrics and Gynecology

## 2016-12-12 ENCOUNTER — Ambulatory Visit (INDEPENDENT_AMBULATORY_CARE_PROVIDER_SITE_OTHER): Payer: BLUE CROSS/BLUE SHIELD | Admitting: Obstetrics and Gynecology

## 2016-12-12 VITALS — BP 126/80 | HR 88 | Ht 65.0 in | Wt 156.5 lb

## 2016-12-12 DIAGNOSIS — E663 Overweight: Secondary | ICD-10-CM | POA: Diagnosis not present

## 2016-12-12 MED ORDER — CYANOCOBALAMIN 1000 MCG/ML IJ SOLN: 1000.0000 ug | INTRAMUSCULAR | 1 refills | Status: DC

## 2016-12-12 MED ORDER — PHENTERMINE HCL 37.5 MG PO TABS: 37.5000 mg | ORAL_TABLET | Freq: Every day | ORAL | 2 refills | Status: DC

## 2016-12-12 MED ORDER — ALPRAZOLAM 0.25 MG PO TABS: 0.2500 mg | ORAL_TABLET | Freq: Three times a day (TID) | ORAL | 2 refills | Status: DC | PRN

## 2016-12-12 NOTE — Progress Notes (Signed)
SUBJECTIVE:  51 y.o. here for follow-up weight loss visit, previously seen 4 weeks ago. Denies any concerns and feels like medication has worked well, and desires to continue. Has been off medicine for 2 weeks. Has lost 15# since January on weight loss medications.  OBJECTIVE:  BP 126/80   Pulse 88   Ht 5\' 5"  (1.651 m)   Wt 156 lb 8 oz (71 kg)   LMP 06/17/2013 (Approximate)   BMI 26.04 kg/m   Body mass index is 26.04 kg/m. Patient appears well.  ASSESSMENT:  Overweight- responding well to weight loss plan  PLAN:  To restart current medications. B12 102400mcg/ml injection given RTC in 4 weeks as planned  Brantlee Hinde [place], CNM

## 2017-01-02 ENCOUNTER — Other Ambulatory Visit: Payer: Self-pay | Admitting: Obstetrics and Gynecology

## 2017-01-09 ENCOUNTER — Ambulatory Visit (INDEPENDENT_AMBULATORY_CARE_PROVIDER_SITE_OTHER): Payer: BLUE CROSS/BLUE SHIELD | Admitting: Obstetrics and Gynecology

## 2017-01-09 VITALS — BP 126/84 | HR 79 | Wt 156.2 lb

## 2017-01-09 DIAGNOSIS — E663 Overweight: Secondary | ICD-10-CM

## 2017-01-09 MED ORDER — CYANOCOBALAMIN 1000 MCG/ML IJ SOLN
1000.0000 ug | Freq: Once | INTRAMUSCULAR | Status: AC
  Administered 2017-01-09: 1000 ug via INTRAMUSCULAR

## 2017-01-09 NOTE — Progress Notes (Signed)
Pt is here for wt, bp check, b-12 inj she is doing well Has been on vacation for 10 days   01/09/17 wt- 156lb 12/12/16 wt- 156lb

## 2017-01-31 DIAGNOSIS — J019 Acute sinusitis, unspecified: Secondary | ICD-10-CM | POA: Diagnosis not present

## 2017-01-31 DIAGNOSIS — B9689 Other specified bacterial agents as the cause of diseases classified elsewhere: Secondary | ICD-10-CM | POA: Diagnosis not present

## 2017-01-31 DIAGNOSIS — H1033 Unspecified acute conjunctivitis, bilateral: Secondary | ICD-10-CM | POA: Diagnosis not present

## 2017-02-05 ENCOUNTER — Ambulatory Visit (INDEPENDENT_AMBULATORY_CARE_PROVIDER_SITE_OTHER): Payer: BLUE CROSS/BLUE SHIELD | Admitting: Obstetrics and Gynecology

## 2017-02-05 VITALS — BP 160/90 | HR 88 | Wt 157.5 lb

## 2017-02-05 DIAGNOSIS — E663 Overweight: Secondary | ICD-10-CM

## 2017-02-06 DIAGNOSIS — E663 Overweight: Secondary | ICD-10-CM

## 2017-02-06 MED ORDER — CYANOCOBALAMIN 1000 MCG/ML IJ SOLN
1000.0000 ug | Freq: Once | INTRAMUSCULAR | Status: AC
  Administered 2017-02-06: 1000 ug via INTRAMUSCULAR

## 2017-02-06 NOTE — Progress Notes (Signed)
Pt is here for wt, bp check, b-12 inj, she is doing well, is exercising a lot, gaining muscle  02/05/17 wt- 157.5lb 01/09/17 wt- 156lb

## 2017-02-24 DIAGNOSIS — H1033 Unspecified acute conjunctivitis, bilateral: Secondary | ICD-10-CM | POA: Diagnosis not present

## 2017-03-03 ENCOUNTER — Ambulatory Visit: Payer: BLUE CROSS/BLUE SHIELD | Admitting: Obstetrics and Gynecology

## 2017-03-07 ENCOUNTER — Encounter: Payer: Self-pay | Admitting: Obstetrics and Gynecology

## 2017-03-07 ENCOUNTER — Ambulatory Visit (INDEPENDENT_AMBULATORY_CARE_PROVIDER_SITE_OTHER): Payer: BLUE CROSS/BLUE SHIELD | Admitting: Obstetrics and Gynecology

## 2017-03-07 VITALS — BP 151/82 | HR 74 | Ht 65.0 in | Wt 154.4 lb

## 2017-03-07 DIAGNOSIS — E663 Overweight: Secondary | ICD-10-CM | POA: Diagnosis not present

## 2017-03-07 DIAGNOSIS — R5383 Other fatigue: Secondary | ICD-10-CM | POA: Diagnosis not present

## 2017-03-07 MED ORDER — CYANOCOBALAMIN 1000 MCG/ML IJ SOLN
1000.0000 ug | Freq: Once | INTRAMUSCULAR | Status: AC
  Administered 2017-03-07: 1000 ug via INTRAMUSCULAR

## 2017-03-07 NOTE — Progress Notes (Signed)
Pt presents for weight, B/P, B-12 injection. No side effects of medication-Phentermine, or B-12.  Weight loss of 3lbs. Encouraged eating healthy and exercise. Pt given mL of B12, in LT deltoid. Pt tolerated well.   Filed Weights   03/07/17 0946  Weight: 154 lb 6.4 oz (70 kg)    02/05/17---157.5lb

## 2017-03-07 NOTE — Patient Instructions (Signed)

## 2017-04-04 ENCOUNTER — Encounter: Payer: BLUE CROSS/BLUE SHIELD | Admitting: Obstetrics and Gynecology

## 2017-04-21 ENCOUNTER — Encounter: Payer: Self-pay | Admitting: Obstetrics and Gynecology

## 2017-04-21 ENCOUNTER — Ambulatory Visit (INDEPENDENT_AMBULATORY_CARE_PROVIDER_SITE_OTHER): Payer: BLUE CROSS/BLUE SHIELD | Admitting: Obstetrics and Gynecology

## 2017-04-21 VITALS — BP 147/84 | HR 70 | Wt 151.7 lb

## 2017-04-21 DIAGNOSIS — E663 Overweight: Secondary | ICD-10-CM

## 2017-04-21 MED ORDER — CYANOCOBALAMIN 1000 MCG/ML IJ SOLN
1000.0000 ug | Freq: Once | INTRAMUSCULAR | Status: AC
  Administered 2017-04-21: 1000 ug via INTRAMUSCULAR

## 2017-04-21 NOTE — Progress Notes (Signed)
Pt is here for wt, bp check, b- 12 inj Pt is doing well, denies any s/e  04/21/17 wt- 151lb 03/07/17 wt- 154lb

## 2017-05-19 ENCOUNTER — Encounter: Payer: Self-pay | Admitting: Obstetrics and Gynecology

## 2017-05-19 ENCOUNTER — Ambulatory Visit (INDEPENDENT_AMBULATORY_CARE_PROVIDER_SITE_OTHER): Payer: BLUE CROSS/BLUE SHIELD | Admitting: Obstetrics and Gynecology

## 2017-05-19 VITALS — BP 150/88 | HR 78 | Wt 149.1 lb

## 2017-05-19 DIAGNOSIS — E663 Overweight: Secondary | ICD-10-CM

## 2017-05-19 MED ORDER — CYANOCOBALAMIN 1000 MCG/ML IJ SOLN
1000.0000 ug | Freq: Once | INTRAMUSCULAR | Status: AC
  Administered 2017-05-19: 1000 ug via INTRAMUSCULAR

## 2017-05-19 MED ORDER — AZITHROMYCIN 250 MG PO TABS: 250.0000 mg | ORAL_TABLET | Freq: Once | ORAL | 0 refills | Status: AC

## 2017-05-19 NOTE — Progress Notes (Signed)
Pt is here for wt, bp check, b-12 inj She is doing very well, happy with her weight loss, she is exercising regularly    05/19/17 wt- 149.1lb 04/21/17 wt- 151lb

## 2017-06-16 ENCOUNTER — Ambulatory Visit: Payer: BLUE CROSS/BLUE SHIELD | Admitting: Obstetrics and Gynecology

## 2017-06-25 ENCOUNTER — Ambulatory Visit (INDEPENDENT_AMBULATORY_CARE_PROVIDER_SITE_OTHER): Payer: BLUE CROSS/BLUE SHIELD | Admitting: Obstetrics and Gynecology

## 2017-06-25 ENCOUNTER — Encounter: Payer: Self-pay | Admitting: Obstetrics and Gynecology

## 2017-06-25 VITALS — BP 148/84 | HR 84 | Wt 156.2 lb

## 2017-06-25 DIAGNOSIS — E663 Overweight: Secondary | ICD-10-CM

## 2017-06-25 MED ORDER — PHENTERMINE HCL 37.5 MG PO TABS: 37.5000 mg | ORAL_TABLET | Freq: Every day | ORAL | 2 refills | Status: DC

## 2017-06-25 NOTE — Progress Notes (Signed)
Pt is here for weight management, explained to pt she needs to take a 2 week break, then start medication

## 2017-07-04 DIAGNOSIS — D485 Neoplasm of uncertain behavior of skin: Secondary | ICD-10-CM | POA: Diagnosis not present

## 2017-07-04 DIAGNOSIS — L905 Scar conditions and fibrosis of skin: Secondary | ICD-10-CM | POA: Diagnosis not present

## 2017-07-17 ENCOUNTER — Ambulatory Visit (INDEPENDENT_AMBULATORY_CARE_PROVIDER_SITE_OTHER): Payer: BLUE CROSS/BLUE SHIELD | Admitting: Obstetrics and Gynecology

## 2017-07-17 ENCOUNTER — Encounter: Payer: Self-pay | Admitting: Obstetrics and Gynecology

## 2017-07-17 VITALS — BP 132/88 | HR 80 | Ht 65.0 in | Wt 152.1 lb

## 2017-07-17 DIAGNOSIS — Z1231 Encounter for screening mammogram for malignant neoplasm of breast: Secondary | ICD-10-CM

## 2017-07-17 DIAGNOSIS — I10 Essential (primary) hypertension: Secondary | ICD-10-CM

## 2017-07-17 DIAGNOSIS — E782 Mixed hyperlipidemia: Secondary | ICD-10-CM | POA: Diagnosis not present

## 2017-07-17 DIAGNOSIS — N951 Menopausal and female climacteric states: Secondary | ICD-10-CM | POA: Diagnosis not present

## 2017-07-17 DIAGNOSIS — Z01419 Encounter for gynecological examination (general) (routine) without abnormal findings: Secondary | ICD-10-CM | POA: Diagnosis not present

## 2017-07-17 DIAGNOSIS — Z720 Tobacco use: Secondary | ICD-10-CM | POA: Diagnosis not present

## 2017-07-17 DIAGNOSIS — Z1239 Encounter for other screening for malignant neoplasm of breast: Secondary | ICD-10-CM

## 2017-07-17 DIAGNOSIS — E663 Overweight: Secondary | ICD-10-CM

## 2017-07-17 DIAGNOSIS — E785 Hyperlipidemia, unspecified: Secondary | ICD-10-CM | POA: Insufficient documentation

## 2017-07-17 DIAGNOSIS — R1900 Intra-abdominal and pelvic swelling, mass and lump, unspecified site: Secondary | ICD-10-CM

## 2017-07-17 HISTORY — DX: Mixed hyperlipidemia: E78.2

## 2017-07-17 MED ORDER — PAROXETINE HCL 20 MG PO TABS: 20.0000 mg | ORAL_TABLET | Freq: Every day | ORAL | 11 refills | Status: DC

## 2017-07-17 NOTE — Patient Instructions (Signed)
Cholesterol               Cholesterol is a white, waxy, fat-like substance that is needed by the human body in small amounts. The liver makes all the cholesterol we need. Cholesterol is carried from the liver by the blood through the blood vessels. Deposits of cholesterol (plaques) may build up on blood vessel (artery) walls. Plaques make the arteries narrower and stiffer. Cholesterol plaques increase the risk for heart attack and stroke. You cannot feel your cholesterol level even if it is very high. The only way to know that it is high is to have a blood test. Once you know your cholesterol levels, you should keep a record of the test results. Work with your health care provider to keep your levels in the desired range. What do the results mean?  Total cholesterol is a rough measure of all the cholesterol in your blood.  LDL (low-density lipoprotein) is the "bad" cholesterol. This is the type that causes plaque to build up on the artery walls. You want this level to be low.  HDL (high-density lipoprotein) is the "good" cholesterol because it cleans the arteries and carries the LDL away. You want this level to be high.  Triglycerides are fat that the body can either burn for energy or store. High levels are closely linked to heart disease. What are the desired levels of cholesterol?  Total cholesterol below 200.  LDL below 100 for people who are at risk, below 70 for people at very high risk.  HDL above 40 is good. A level of 60 or higher is considered to be protective against heart disease.  Triglycerides below 150. How can I lower my cholesterol? Diet Follow your diet program as told by your health care provider.  Choose fish or white meat chicken and [place], roasted or baked. Limit fatty cuts of red meat, fried foods, and processed meats, such as sausage and lunch meats.  Eat lots of fresh fruits and vegetables.  Choose whole grains, beans, pasta, potatoes, and  cereals.  Choose olive oil, corn oil, or canola oil, and use only small amounts.  Avoid butter, mayonnaise, shortening, or palm kernel oils.  Avoid foods with trans fats.  Drink skim or nonfat milk and eat low-fat or nonfat yogurt and cheeses. Avoid whole milk, cream, ice cream, egg yolks, and full-fat cheeses.  Healthier desserts include angel food cake, ginger snaps, animal crackers, hard candy, popsicles, and low-fat or nonfat frozen yogurt. Avoid pastries, cakes, pies, and cookies.  Exercise  Follow your exercise program as told by your health care provider. A regular program: ? Helps to decrease LDL and raise HDL. ? Helps with weight control.  Do things that increase your activity level, such as gardening, walking, and taking the stairs.  Ask your health care provider about ways that you can be more active in your daily life.  Medicine  Take over-the-counter and prescription medicines only as told by your health care provider. ? Medicine may be prescribed by your health care provider to help lower cholesterol and decrease the risk for heart disease. This is usually done if diet and exercise have failed to bring down cholesterol levels. ? If you have several risk factors, you may need medicine even if your levels are normal.  This information is not intended to replace advice given to you by your health care provider. Make sure you discuss any questions you have with your health care provider. Document Released: 02/26/2001 Document Revised:  12/30/2015 Document Reviewed: 12/02/2015 Elsevier Interactive Patient Education  2018 [employer] Maintenance for Postmenopausal Women Menopause is a normal process in which your reproductive ability comes to an end. This process happens gradually over a span of months to years, usually between the ages of 67 and 55. Menopause is complete when you have missed 12 consecutive menstrual periods. It is important to talk with your  health care provider about some of the most common conditions that affect postmenopausal women, such as heart disease, cancer, and bone loss (osteoporosis). Adopting a healthy lifestyle and getting preventive care can help to promote your health and wellness. Those actions can also lower your chances of developing some of these common conditions. What should I know about menopause? During menopause, you may experience a number of symptoms, such as:  Moderate-to-severe hot flashes.  Night sweats.  Decrease in sex drive.  Mood swings.  Headaches.  Tiredness.  Irritability.  Memory problems.  Insomnia.  Choosing to treat or not to treat menopausal changes is an individual decision that you make with your health care provider. What should I know about hormone replacement therapy and supplements? Hormone therapy products are effective for treating symptoms that are associated with menopause, such as hot flashes and night sweats. Hormone replacement carries certain risks, especially as you become older. If you are thinking about using estrogen or estrogen with progestin treatments, discuss the benefits and risks with your health care provider. What should I know about heart disease and stroke? Heart disease, heart attack, and stroke become more likely as you age. This may be due, in part, to the hormonal changes that your body experiences during menopause. These can affect how your body processes dietary fats, triglycerides, and cholesterol. Heart attack and stroke are both medical emergencies. There are many things that you can do to help prevent heart disease and stroke:  Have your blood pressure checked at least every 1-2 years. High blood pressure causes heart disease and increases the risk of stroke.  If you are 64-28 years old, ask your health care provider if you should take aspirin to prevent a heart attack or a stroke.  Do not use any tobacco products, including cigarettes,  chewing tobacco, or electronic cigarettes. If you need help quitting, ask your health care provider.  It is important to eat a healthy diet and maintain a healthy weight. ? Be sure to include plenty of vegetables, fruits, low-fat dairy products, and lean protein. ? Avoid eating foods that are high in solid fats, added sugars, or salt (sodium).  Get regular exercise. This is one of the most important things that you can do for your health. ? Try to exercise for at least 150 minutes each week. The type of exercise that you do should increase your heart rate and make you sweat. This is known as moderate-intensity exercise. ? Try to do strengthening exercises at least twice each week. Do these in addition to the moderate-intensity exercise.  Know your numbers.Ask your health care provider to check your cholesterol and your blood glucose. Continue to have your blood tested as directed by your health care provider.  What should I know about cancer screening? There are several types of cancer. Take the following steps to reduce your risk and to catch any cancer development as early as possible. Breast Cancer  Practice breast self-awareness. ? This means understanding how your breasts normally appear and feel. ? It also means doing regular breast self-exams. Let your  health care provider know about any changes, no matter how small.  If you are 37 or older, have a clinician do a breast exam (clinical breast exam or CBE) every year. Depending on your age, family history, and medical history, it may be recommended that you also have a yearly breast X-ray (mammogram).  If you have a family history of breast cancer, talk with your health care provider about genetic screening.  If you are at high risk for breast cancer, talk with your health care provider about having an MRI and a mammogram every year.  Breast cancer (BRCA) gene test is recommended for women who have family members with BRCA-related  cancers. Results of the assessment will determine the need for genetic counseling and BRCA1 and for BRCA2 testing. BRCA-related cancers include these types: ? Breast. This occurs in males or females. ? Ovarian. ? Tubal. This may also be called fallopian tube cancer. ? Cancer of the abdominal or pelvic lining (peritoneal cancer). ? Prostate. ? Pancreatic.  Cervical, Uterine, and Ovarian Cancer Your health care provider may recommend that you be screened regularly for cancer of the pelvic organs. These include your ovaries, uterus, and vagina. This screening involves a pelvic exam, which includes checking for microscopic changes to the surface of your cervix (Pap test).  For women ages 21-65, health care providers may recommend a pelvic exam and a Pap test every three years. For women ages 65-65, they may recommend the Pap test and pelvic exam, combined with testing for human papilloma virus (HPV), every five years. Some types of HPV increase your risk of cervical cancer. Testing for HPV may also be done on women of any age who have unclear Pap test results.  Other health care providers may not recommend any screening for nonpregnant women who are considered low risk for pelvic cancer and have no symptoms. Ask your health care provider if a screening pelvic exam is right for you.  If you have had past treatment for cervical cancer or a condition that could lead to cancer, you need Pap tests and screening for cancer for at least 20 years after your treatment. If Pap tests have been discontinued for you, your risk factors (such as having a new sexual partner) need to be reassessed to determine if you should start having screenings again. Some women have medical problems that increase the chance of getting cervical cancer. In these cases, your health care provider may recommend that you have screening and Pap tests more often.  If you have a family history of uterine cancer or ovarian cancer, talk with  your health care provider about genetic screening.  If you have vaginal bleeding after reaching menopause, tell your health care provider.  There are currently no reliable tests available to screen for ovarian cancer.  Lung Cancer Lung cancer screening is recommended for adults 59-49 years old who are at high risk for lung cancer because of a history of smoking. A yearly low-dose CT scan of the lungs is recommended if you:  Currently smoke.  Have a history of at least 30 pack-years of smoking and you currently smoke or have quit within the past 15 years. A pack-year is smoking an average of one pack of cigarettes per day for one year.  Yearly screening should:  Continue until it has been 15 years since you quit.  Stop if you develop a health problem that would prevent you from having lung cancer treatment.  Colorectal Cancer  This type of cancer  can be detected and can often be prevented.  Routine colorectal cancer screening usually begins at age 41 and continues through age 14.  If you have risk factors for colon cancer, your health care provider may recommend that you be screened at an earlier age.  If you have a family history of colorectal cancer, talk with your health care provider about genetic screening.  Your health care provider may also recommend using home test kits to check for hidden blood in your stool.  A small camera at the end of a tube can be used to examine your colon directly (sigmoidoscopy or colonoscopy). This is done to check for the earliest forms of colorectal cancer.  Direct examination of the colon should be repeated every 5-10 years until age 27. However, if early forms of precancerous polyps or small growths are found or if you have a family history or genetic risk for colorectal cancer, you may need to be screened more often.  Skin Cancer  Check your skin from head to toe regularly.  Monitor any moles. Be sure to tell your health care  provider: ? About any new moles or changes in moles, especially if there is a change in a mole's shape or color. ? If you have a mole that is larger than the size of a pencil eraser.  If any of your family members has a history of skin cancer, especially at a young age, talk with your health care provider about genetic screening.  Always use sunscreen. Apply sunscreen liberally and repeatedly throughout the day.  Whenever you are outside, protect yourself by wearing long sleeves, pants, a wide-brimmed hat, and sunglasses.  What should I know about osteoporosis? Osteoporosis is a condition in which bone destruction happens more quickly than new bone creation. After menopause, you may be at an increased risk for osteoporosis. To help prevent osteoporosis or the bone fractures that can happen because of osteoporosis, the following is recommended:  If you are 87-10 years old, get at least 1,000 mg of calcium and at least 600 mg of vitamin D per day.  If you are older than age 27 but younger than age 35, get at least 1,200 mg of calcium and at least 600 mg of vitamin D per day.  If you are older than age 28, get at least 1,200 mg of calcium and at least 800 mg of vitamin D per day.  Smoking and excessive alcohol intake increase the risk of osteoporosis. Eat foods that are rich in calcium and vitamin D, and do weight-bearing exercises several times each week as directed by your health care provider. What should I know about how menopause affects my mental health? Depression may occur at any age, but it is more common as you become older. Common symptoms of depression include:  Low or sad mood.  Changes in sleep patterns.  Changes in appetite or eating patterns.  Feeling an overall lack of motivation or enjoyment of activities that you previously enjoyed.  Frequent crying spells.  Talk with your health care provider if you think that you are experiencing depression. What should I know  about immunizations? It is important that you get and maintain your immunizations. These include:  Tetanus, diphtheria, and pertussis (Tdap) booster vaccine.  Influenza every year before the flu season begins.  Pneumonia vaccine.  Shingles vaccine.  Your health care provider may also recommend other immunizations. This information is not intended to replace advice given to you by your health care provider.  Make sure you discuss any questions you have with your health care provider. Document Released: 07/26/2005 Document Revised: 12/22/2015 Document Reviewed: 03/07/2015 Elsevier Interactive Patient Education  2018 [employer].

## 2017-07-17 NOTE — Progress Notes (Signed)
ANNUAL PREVENTATIVE CARE GYNECOLOGY  ENCOUNTER NOTE  Subjective:       Kathleen Dennis is a 52 y.o. G1P0010 postmenopausal female here for a routine annual gynecologic exam. The patient is sexually active. The patient is not taking hormone replacement therapy. Patient denies post-menopausal vaginal bleeding. The patient wears seatbelts: yes. The patient participates in regular exercise: yes (4-5 x weekly). The patient reports that there is not domestic violence in her life.  Current complaints: 1.  Patient notes that her night sweats have gotten worse.  Has been on Paxil for several years, and no longer feels as though it is helping as much.  Desires to try an increased dose.  2. Feels like at times her ovaries "feel full and heavy", almost likes she's about to have a period, although she has been menopausal for several years. Happens "every now and then". Denies pain.    Gynecologic History Patient's last menstrual period was 06/17/2013 (approximate). Contraception: post menopausal status Last Pap: 06/2015 (performed in [place]). Results were: normal Last mammogram: ~ 10/2016 (performed at [facility]). Results were: normal   Obstetric History OB History  Gravida Para Term Preterm AB Living  1       1    SAB TAB Ectopic Multiple Live Births  1            # Outcome Date GA Lbr Len/2nd Weight Sex Delivery Anes PTL Lv  1 SAB 1989              Past Medical History:  Diagnosis Date  . ADHD (attention deficit hyperactivity disorder) 07/16/2016  . Anxiety   . Elevated cholesterol with high triglycerides 07/17/2017  . GERD (gastroesophageal reflux disease) 07/16/2016  . Hypertension     Family History  Problem Relation Age of Onset  . Thyroid disease Mother   . Diabetes Father   . Hypertension Father   . Heart disease Maternal Grandmother   . Heart disease Maternal Grandfather     Past Surgical History:  Procedure Laterality Date  . BREAST ENHANCEMENT  SURGERY      Social History   Socioeconomic History  . Marital status: Single    Spouse name: Not on file  . Number of children: Not on file  . Years of education: Not on file  . Highest education level: Not on file  Social Needs  . Financial resource strain: Not on file  . Food insecurity - worry: Not on file  . Food insecurity - inability: Not on file  . Transportation needs - medical: Not on file  . Transportation needs - non-medical: Not on file  Occupational History  . Not on file  Tobacco Use  . Smoking status: Light Tobacco Smoker    Packs/day: 0.25    Years: 5.00    Pack years: 1.25    Types: Cigarettes  . Smokeless tobacco: Never Used  Substance and Sexual Activity  . Alcohol use: Yes    Comment: socially  . Drug use: No  . Sexual activity: Yes    Birth control/protection: Post-menopausal  Other Topics Concern  . Not on file  Social History Narrative  . Not on file    Current Outpatient Medications on File Prior to Visit  Medication Sig Dispense Refill  . ALPRAZolam (XANAX) 0.25 MG tablet Take 1 tablet (0.25 mg total) by mouth 3 (three) times daily as needed for anxiety. 30 tablet 2  . calcium carbonate (CALCIUM 600) 600 MG TABS tablet Take by mouth.    .Marland Kitchen  cetirizine (ZYRTEC) 10 MG tablet Take by mouth.    . cyanocobalamin (,VITAMIN B-12,) 1000 MCG/ML injection Inject 1 mL (1,000 mcg total) into the muscle every 30 (thirty) days. 3 mL 1  . lansoprazole (PREVACID) 30 MG capsule Take 1 capsule (30 mg total) by mouth daily. 30 capsule 11  . losartan-hydrochlorothiazide (HYZAAR) 100-12.5 MG tablet TAKE 1 TABLET BY MOUTH DAILY    . phentermine (ADIPEX-P) 37.5 MG tablet Take 1 tablet (37.5 mg total) by mouth daily before breakfast. 30 tablet 2  . pyridoxine (B-6) 100 MG tablet Take by mouth.     Marland Kitchen PARoxetine (PAXIL) 10 MG tablet Take 1 tablet (10 mg total) by mouth daily.    No current facility-administered medications on file prior to visit.      Allergies    Allergen Reactions  . Sulfa Antibiotics Hives    Review of Systems ROS Review of Systems - General ROS: negative for - chills, fatigue, fever, hot flashes, night sweats, weight gain.  Positive for intentional weight loss of 22 lbs.  Psychological ROS: negative for - anxiety, decreased libido, depression, mood swings, physical abuse or sexual abuse Ophthalmic ROS: negative for - blurry vision, eye pain or loss of vision ENT ROS: negative for - headaches, hearing change, visual changes or vocal changes Allergy and Immunology ROS: negative for - hives, itchy/watery eyes or seasonal allergies Hematological and Lymphatic ROS: negative for - bleeding problems, bruising, swollen lymph nodes or weight loss Endocrine ROS: negative for - galactorrhea, hair pattern changes, hot flashes, malaise/lethargy, mood swings, palpitations, polydipsia/polyuria, skin changes, or unexpected weight changes.  Positive for night sweats Breast ROS: negative for - new or changing breast lumps or nipple discharge Respiratory ROS: negative for - cough or shortness of breath Cardiovascular ROS: negative for - chest pain, irregular heartbeat, palpitations or shortness of breath Gastrointestinal ROS: no abdominal pain, change in bowel habits, or black or bloody stools Genito-Urinary ROS: no dysuria, trouble voiding, or hematuria.  Positive for intermittent pelvic heaviness (see HPI) Musculoskeletal ROS: negative for - joint pain or joint stiffness Neurological ROS: negative for - bowel and bladder control changes Dermatological ROS: negative for rash and skin lesion changes   Objective:   BP 132/88   Pulse 80   Ht 5\' 5"  (1.651 m)   Wt 152 lb 1.6 oz (69 kg)   LMP 06/17/2013 (Approximate)   BMI 25.31 kg/m    CONSTITUTIONAL: Well-developed, well-nourished female in no acute distress. Overweight.  PSYCHIATRIC: Normal mood and affect. Normal behavior. Normal judgment and thought content. NEUROLGIC: Alert and oriented  to person, place, and time. Normal muscle tone coordination. No cranial nerve deficit noted. HENT:  Normocephalic, atraumatic, External right and left ear normal. Oropharynx is clear and moist EYES: Conjunctivae and EOM are normal. Pupils are equal, round, and reactive to light. No scleral icterus.  NECK: Normal range of motion, supple, no masses.  Normal thyroid.  SKIN: Skin is warm and dry. No rash noted. Not diaphoretic. No erythema. No pallor. CARDIOVASCULAR: Normal heart rate noted, regular rhythm, no murmur. RESPIRATORY: Clear to auscultation bilaterally. Effort and breath sounds normal, no problems with respiration noted. BREASTS: Symmetric in size. No masses, skin changes, nipple drainage, or lymphadenopathy. Implants palpable.  ABDOMEN: Soft, normal bowel sounds, no distention noted.  No tenderness, rebound or guarding.  BLADDER: Normal PELVIC:  Bladder no bladder distension noted  Urethra: normal appearing urethra with no masses, tenderness or lesions  Vulva: normal appearing vulva with no masses, tenderness or  lesions  Vagina: normal appearing vagina with normal color and discharge, no lesions and atrophic  Cervix: normal appearing cervix without discharge or lesions  Uterus: uterus is normal size, shape, consistency and nontender  Adnexa: normal adnexa in size, nontender and no masses  RV: External Exam NormaI, No Rectal Masses and Normal Sphincter tone  MUSCULOSKELETAL: Normal range of motion. No tenderness.  No cyanosis, clubbing, or edema.  2+ distal pulses. LYMPHATIC: No Axillary, Supraclavicular, or Inguinal Adenopathy.    Labs:  Lab Results  Component Value Date   WBC 5.6 07/16/2016   HGB 14.3 07/16/2016   HCT 42.5 07/16/2016   MCV 102 (H) 07/16/2016   PLT 185 07/16/2016      Chemistry      Component Value Date/Time   NA 137 09/12/2016 0845   K 4.1 09/12/2016 0845   CL 97 09/12/2016 0845   CO2 21 09/12/2016 0845   BUN 10 09/12/2016 0845   CREATININE 0.68  09/12/2016 0845      Component Value Date/Time   CALCIUM 9.5 09/12/2016 0845   ALKPHOS 94 09/12/2016 0845   AST 38 09/12/2016 0845   ALT 35 (H) 09/12/2016 0845   BILITOT 0.3 09/12/2016 0845      Lab Results  Component Value Date   CHOL 217 (H) 07/16/2016   HDL 46 07/16/2016   LDLCALC Comment 07/16/2016   TRIG 805 (HH) 07/16/2016   CHOLHDL 4.7 (H) 07/16/2016    Lab Results  Component Value Date   TSH 3.130 07/16/2016      Assessment:   Annual gynecologic examination 52 y.o., postmenopausal Contraception: post menopausal status Overweight  Tobacco abuse CHTN Dyslipidemia (elevated triglycerides) Ovarian fullness  Plan:  - Pap: Not needed, up to date.  - Mammogram: Ordered - Stool Guaiac Testing:  Not Indicated.  Will be due for her first screening colonoscopy after October 2019.  - Labs: CBC, CMP and Lipid 1.  - Routine preventative health maintenance measures emphasized: Exercise/Diet/Weight control, Tobacco Warnings, Alcohol/Substance use risks and Stress Management  - To continue Paxil for menopausal vasomotor symptoms.  Will increase dose from 10 mg to 20 mg.  - CHTN, currently controlled on meds. Does not currently have a PCP. As long as it is controlled, will continue to manage, otherwise, patient will need referral to PCP.  - Dyslipidemia, patient does not have a PCP. Has been dieting and exercising. Will repeat levels. If she has improved, will continue with current modifications. If still significantly elevated, will likely need a referral for a PCP to manage.  - Ovarian fullness, intermittent, sometimes cyclical.  Normal pelvic exam. Discussed that if symptoms become more persistent or painful then an ultrasound may be warranted. For now, changes may be associated with normal physiologic changes of menopause.  - Return to Clinic - 1 Year   Hildred Laser, MD [facility]

## 2017-07-18 ENCOUNTER — Other Ambulatory Visit: Payer: Self-pay | Admitting: Obstetrics and Gynecology

## 2017-07-18 LAB — CBC
HEMATOCRIT: 41.3 % (ref 34.0–46.6)
Hemoglobin: 14 g/dL (ref 11.1–15.9)
MCH: 34.4 pg — AB (ref 26.6–33.0)
MCHC: 33.9 g/dL (ref 31.5–35.7)
MCV: 102 fL — AB (ref 79–97)
Platelets: 205 10*3/uL (ref 150–379)
RBC: 4.07 x10E6/uL (ref 3.77–5.28)
RDW: 12.8 % (ref 12.3–15.4)
WBC: 5.5 10*3/uL (ref 3.4–10.8)

## 2017-07-18 LAB — COMPREHENSIVE METABOLIC PANEL
A/G RATIO: 1.6 (ref 1.2–2.2)
ALT: 32 IU/L (ref 0–32)
AST: 44 IU/L — ABNORMAL HIGH (ref 0–40)
Albumin: 4.2 g/dL (ref 3.5–5.5)
Alkaline Phosphatase: 95 IU/L (ref 39–117)
BUN/Creatinine Ratio: 29 — ABNORMAL HIGH (ref 9–23)
BUN: 17 mg/dL (ref 6–24)
Bilirubin Total: 0.4 mg/dL (ref 0.0–1.2)
CALCIUM: 9 mg/dL (ref 8.7–10.2)
CHLORIDE: 100 mmol/L (ref 96–106)
CO2: 22 mmol/L (ref 20–29)
Creatinine, Ser: 0.58 mg/dL (ref 0.57–1.00)
GFR calc Af Amer: 125 mL/min/{1.73_m2} (ref >59)
GFR, EST NON AFRICAN AMERICAN: 109 mL/min/{1.73_m2} (ref >59)
GLOBULIN, TOTAL: 2.6 g/dL (ref 1.5–4.5)
Glucose: 81 mg/dL (ref 65–99)
POTASSIUM: 4.5 mmol/L (ref 3.5–5.2)
SODIUM: 140 mmol/L (ref 134–144)
TOTAL PROTEIN: 6.8 g/dL (ref 6.0–8.5)

## 2017-07-18 LAB — LIPID PANEL
CHOL/HDL RATIO: 10.3 ratio — AB (ref 0.0–4.4)
Cholesterol, Total: 267 mg/dL — ABNORMAL HIGH (ref 100–199)
HDL: 26 mg/dL — ABNORMAL LOW (ref >39)
Triglycerides: 1675 mg/dL (ref 0–149)

## 2017-07-21 ENCOUNTER — Other Ambulatory Visit: Payer: Self-pay | Admitting: Obstetrics and Gynecology

## 2017-07-21 DIAGNOSIS — E785 Hyperlipidemia, unspecified: Secondary | ICD-10-CM

## 2017-07-21 MED ORDER — ATORVASTATIN CALCIUM 40 MG PO TABS: 40.0000 mg | ORAL_TABLET | Freq: Every day | ORAL | 3 refills | Status: DC

## 2017-07-25 ENCOUNTER — Encounter: Payer: Self-pay | Admitting: Obstetrics and Gynecology

## 2017-07-28 ENCOUNTER — Ambulatory Visit (INDEPENDENT_AMBULATORY_CARE_PROVIDER_SITE_OTHER): Payer: BLUE CROSS/BLUE SHIELD | Admitting: Certified Nurse Midwife

## 2017-07-28 ENCOUNTER — Encounter: Payer: Self-pay | Admitting: Obstetrics and Gynecology

## 2017-07-28 VITALS — BP 140/88 | HR 84 | Wt 150.2 lb

## 2017-07-28 DIAGNOSIS — Z0130 Encounter for examination of blood pressure without abnormal findings: Secondary | ICD-10-CM

## 2017-07-28 MED ORDER — CYANOCOBALAMIN 1000 MCG/ML IJ SOLN
1000.0000 ug | Freq: Once | INTRAMUSCULAR | Status: AC
  Administered 2017-07-28: 1000 ug via INTRAMUSCULAR

## 2017-07-28 NOTE — Progress Notes (Signed)
Pt is here for wt,bp check, b-12 inj She is doing well  07/28/17 wt-150.5lb 07/17/17 wt- 152lb

## 2017-07-28 NOTE — Progress Notes (Signed)
I have reviewed the record and concur with patient management and plan of care.    Milka Windholz Michelle Joban Colledge, CNM [facility] 

## 2017-08-29 ENCOUNTER — Ambulatory Visit: Payer: BLUE CROSS/BLUE SHIELD

## 2017-09-01 ENCOUNTER — Ambulatory Visit (INDEPENDENT_AMBULATORY_CARE_PROVIDER_SITE_OTHER): Payer: BLUE CROSS/BLUE SHIELD | Admitting: Obstetrics and Gynecology

## 2017-09-01 VITALS — BP 150/88 | Wt 148.0 lb

## 2017-09-01 DIAGNOSIS — E663 Overweight: Secondary | ICD-10-CM

## 2017-09-01 MED ORDER — CYANOCOBALAMIN 1000 MCG/ML IJ SOLN
1000.0000 ug | Freq: Once | INTRAMUSCULAR | Status: AC
  Administered 2017-09-01: 1000 ug via INTRAMUSCULAR

## 2017-09-01 MED ORDER — LEVOFLOXACIN 500 MG PO TABS: 500.0000 mg | ORAL_TABLET | Freq: Every day | ORAL | 1 refills | Status: DC

## 2017-09-01 NOTE — Progress Notes (Signed)
Pt is here for wt, bp check, b-12 inj She is doing well  09/01/17 wt- 148lb 07/28/17 wt- 150lb

## 2017-09-12 ENCOUNTER — Other Ambulatory Visit: Payer: Self-pay | Admitting: Obstetrics and Gynecology

## 2017-09-30 ENCOUNTER — Ambulatory Visit (INDEPENDENT_AMBULATORY_CARE_PROVIDER_SITE_OTHER): Payer: BLUE CROSS/BLUE SHIELD | Admitting: Obstetrics and Gynecology

## 2017-09-30 ENCOUNTER — Encounter: Payer: Self-pay | Admitting: Obstetrics and Gynecology

## 2017-09-30 VITALS — BP 115/75 | HR 90 | Ht 65.0 in | Wt 153.4 lb

## 2017-09-30 DIAGNOSIS — E663 Overweight: Secondary | ICD-10-CM | POA: Diagnosis not present

## 2017-09-30 MED ORDER — CYANOCOBALAMIN 1000 MCG/ML IJ SOLN: 1000.0000 ug | INTRAMUSCULAR | 1 refills | Status: DC

## 2017-09-30 NOTE — Progress Notes (Signed)
SUBJECTIVE:  52 y.o. here for follow-up weight loss visit, previously seen 4 weeks ago. Denies any concerns and feels like medication is working well, but stalled this month. Is working out 4 days a week with person trainer.  OBJECTIVE:  BP 115/75   Pulse 90   Ht 5\' 5"  (1.651 m)   Wt 153 lb 6.4 oz (69.6 kg)   LMP 06/17/2013 (Approximate)   BMI 25.53 kg/m   Body mass index is 25.53 kg/m. Patient appears well. ASSESSMENT:  Overweight- responding well to weight loss plan PLAN:  To continue with current medications(will increase to one pill  In am and 1/2 at lunch) after a two week holiday.. B12 104300mcg/ml injection given RTC in 6 weeks as planned  Kathleen Dennis, CNM

## 2017-10-17 ENCOUNTER — Other Ambulatory Visit: Payer: Self-pay | Admitting: Obstetrics and Gynecology

## 2017-10-17 DIAGNOSIS — E785 Hyperlipidemia, unspecified: Secondary | ICD-10-CM

## 2017-10-20 ENCOUNTER — Encounter: Payer: Self-pay | Admitting: General Practice

## 2017-11-17 ENCOUNTER — Ambulatory Visit (INDEPENDENT_AMBULATORY_CARE_PROVIDER_SITE_OTHER): Payer: BLUE CROSS/BLUE SHIELD | Admitting: Obstetrics and Gynecology

## 2017-11-17 ENCOUNTER — Encounter: Payer: Self-pay | Admitting: Obstetrics and Gynecology

## 2017-11-17 VITALS — BP 160/90 | HR 81 | Wt 154.9 lb

## 2017-11-17 DIAGNOSIS — E663 Overweight: Secondary | ICD-10-CM

## 2017-11-17 MED ORDER — CYANOCOBALAMIN 1000 MCG/ML IJ SOLN
1000.0000 ug | Freq: Once | INTRAMUSCULAR | Status: AC
  Administered 2017-11-17: 1000 ug via INTRAMUSCULAR

## 2017-11-17 NOTE — Progress Notes (Signed)
Pt is here for wt, bp check, b-12 inj She hasnt been taking her phentermine since April, just found her rx from last visit  11/17/17 wt- 154.9 09/30/17 wt- 153lb

## 2017-12-04 ENCOUNTER — Ambulatory Visit: Payer: BLUE CROSS/BLUE SHIELD | Admitting: Internal Medicine

## 2017-12-04 ENCOUNTER — Encounter: Payer: Self-pay | Admitting: Internal Medicine

## 2017-12-04 VITALS — BP 156/100 | HR 101 | Temp 98.7°F | Ht 65.0 in | Wt 152.6 lb

## 2017-12-04 DIAGNOSIS — Z0184 Encounter for antibody response examination: Secondary | ICD-10-CM

## 2017-12-04 DIAGNOSIS — Z1329 Encounter for screening for other suspected endocrine disorder: Secondary | ICD-10-CM | POA: Diagnosis not present

## 2017-12-04 DIAGNOSIS — R748 Abnormal levels of other serum enzymes: Secondary | ICD-10-CM

## 2017-12-04 DIAGNOSIS — Z1159 Encounter for screening for other viral diseases: Secondary | ICD-10-CM

## 2017-12-04 DIAGNOSIS — Z1389 Encounter for screening for other disorder: Secondary | ICD-10-CM

## 2017-12-04 DIAGNOSIS — E663 Overweight: Secondary | ICD-10-CM | POA: Diagnosis not present

## 2017-12-04 DIAGNOSIS — Z1231 Encounter for screening mammogram for malignant neoplasm of breast: Secondary | ICD-10-CM

## 2017-12-04 DIAGNOSIS — E559 Vitamin D deficiency, unspecified: Secondary | ICD-10-CM

## 2017-12-04 DIAGNOSIS — E785 Hyperlipidemia, unspecified: Secondary | ICD-10-CM

## 2017-12-04 DIAGNOSIS — I10 Essential (primary) hypertension: Secondary | ICD-10-CM

## 2017-12-04 DIAGNOSIS — R7989 Other specified abnormal findings of blood chemistry: Secondary | ICD-10-CM

## 2017-12-04 DIAGNOSIS — Z23 Encounter for immunization: Secondary | ICD-10-CM | POA: Diagnosis not present

## 2017-12-04 MED ORDER — LOSARTAN POTASSIUM-HCTZ 100-25 MG PO TABS: 1.0000 | ORAL_TABLET | Freq: Every day | ORAL | 3 refills | Status: DC

## 2017-12-04 MED ORDER — FENOFIBRATE 48 MG PO TABS: 48.0000 mg | ORAL_TABLET | Freq: Every day | ORAL | 0 refills | Status: DC

## 2017-12-04 NOTE — Patient Instructions (Addendum)
Please call and schedule mammogram We will order Ultrasound of your abdomen to see why liver enzymes elevated    Cholesterol   Cholesterol is a white, waxy, fat-like substance that is needed by the human body in small amounts. The liver makes all the cholesterol we need. Cholesterol is carried from the liver by the blood through the blood vessels. Deposits of cholesterol (plaques) may build up on blood vessel (artery) walls. Plaques make the arteries narrower and stiffer. Cholesterol plaques increase the risk for heart attack and stroke. You cannot feel your cholesterol level even if it is very high. The only way to know that it is high is to have a blood test. Once you know your cholesterol levels, you should keep a record of the test results. Work with your health care provider to keep your levels in the desired range. What do the results mean?  Total cholesterol is a rough measure of all the cholesterol in your blood.  LDL (low-density lipoprotein) is the "bad" cholesterol. This is the type that causes plaque to build up on the artery walls. You want this level to be low.  HDL (high-density lipoprotein) is the "good" cholesterol because it cleans the arteries and carries the LDL away. You want this level to be high.  Triglycerides are fat that the body can either burn for energy or store. High levels are closely linked to heart disease. What are the desired levels of cholesterol?  Total cholesterol below 200.  LDL below 100 for people who are at risk, below 70 for people at very high risk.  HDL above 40 is good. A level of 60 or higher is considered to be protective against heart disease.  Triglycerides below 150. How can I lower my cholesterol? Diet Follow your diet program as told by your health care provider.  Choose fish or white meat chicken and [place], roasted or baked. Limit fatty cuts of red meat, fried foods, and processed meats, such as sausage and lunch meats.  Eat lots  of fresh fruits and vegetables.  Choose whole grains, beans, pasta, potatoes, and cereals.  Choose olive oil, corn oil, or canola oil, and use only small amounts.  Avoid butter, mayonnaise, shortening, or palm kernel oils.  Avoid foods with trans fats.  Drink skim or nonfat milk and eat low-fat or nonfat yogurt and cheeses. Avoid whole milk, cream, ice cream, egg yolks, and full-fat cheeses.  Healthier desserts include angel food cake, ginger snaps, animal crackers, hard candy, popsicles, and low-fat or nonfat frozen yogurt. Avoid pastries, cakes, pies, and cookies.  Exercise  Follow your exercise program as told by your health care provider. A regular program: ? Helps to decrease LDL and raise HDL. ? Helps with weight control.  Do things that increase your activity level, such as gardening, walking, and taking the stairs.  Ask your health care provider about ways that you can be more active in your daily life.  Medicine  Take over-the-counter and prescription medicines only as told by your health care provider. ? Medicine may be prescribed by your health care provider to help lower cholesterol and decrease the risk for heart disease. This is usually done if diet and exercise have failed to bring down cholesterol levels. ? If you have several risk factors, you may need medicine even if your levels are normal.  This information is not intended to replace advice given to you by your health care provider. Make sure you discuss any questions you have with  your health care provider. Document Released: 02/26/2001 Document Revised: 12/30/2015 Document Reviewed: 12/02/2015 Elsevier Interactive Patient Education  2018 [employer].  DASH Eating Plan DASH stands for "Dietary Approaches to Stop Hypertension." The DASH eating plan is a healthy eating plan that has been shown to reduce high blood pressure (hypertension). It may also reduce your risk for type 2 diabetes, heart disease, and  stroke. The DASH eating plan may also help with weight loss. What are tips for following this plan? General guidelines  Avoid eating more than 2,300 mg (milligrams) of salt (sodium) a day. If you have hypertension, you may need to reduce your sodium intake to 1,500 mg a day.  Limit alcohol intake to no more than 1 drink a day for nonpregnant women and 2 drinks a day for men. One drink equals 12 oz of beer, 5 oz of wine, or 1 oz of hard liquor.  Work with your health care provider to maintain a healthy body weight or to lose weight. Ask what an ideal weight is for you.  Get at least 30 minutes of exercise that causes your heart to beat faster (aerobic exercise) most days of the week. Activities may include walking, swimming, or biking.  Work with your health care provider or diet and nutrition specialist (dietitian) to adjust your eating plan to your individual calorie needs. Reading food labels  Check food labels for the amount of sodium per serving. Choose foods with less than 5 percent of the Daily Value of sodium. Generally, foods with less than 300 mg of sodium per serving fit into this eating plan.  To find whole grains, look for the word "whole" as the first word in the ingredient list. Shopping  Buy products labeled as "low-sodium" or "no salt added."  Buy fresh foods. Avoid canned foods and premade or frozen meals. Cooking  Avoid adding salt when cooking. Use salt-free seasonings or herbs instead of table salt or sea salt. Check with your health care provider or pharmacist before using salt substitutes.  Do not fry foods. Cook foods using healthy methods such as baking, boiling, grilling, and broiling instead.  Cook with heart-healthy oils, such as olive, canola, soybean, or sunflower oil. Meal planning   Eat a balanced diet that includes: ? 5 or more servings of fruits and vegetables each day. At each meal, try to fill half of your plate with fruits and vegetables. ? Up  to 6-8 servings of whole grains each day. ? Less than 6 oz of lean meat, poultry, or fish each day. A 3-oz serving of meat is about the same size as a deck of cards. One egg equals 1 oz. ? 2 servings of low-fat dairy each day. ? A serving of nuts, seeds, or beans 5 times each week. ? Heart-healthy fats. Healthy fats called Omega-3 fatty acids are found in foods such as flaxseeds and coldwater fish, like sardines, salmon, and mackerel.  Limit how much you eat of the following: ? Canned or prepackaged foods. ? Food that is high in trans fat, such as fried foods. ? Food that is high in saturated fat, such as fatty meat. ? Sweets, desserts, sugary drinks, and other foods with added sugar. ? Full-fat dairy products.  Do not salt foods before eating.  Try to eat at least 2 vegetarian meals each week.  Eat more home-cooked food and less restaurant, buffet, and fast food.  When eating at a restaurant, ask that your food be prepared with less salt or  no salt, if possible. What foods are recommended? The items listed may not be a complete list. Talk with your dietitian about what dietary choices are best for you. Grains Whole-grain or whole-wheat bread. Whole-grain or whole-wheat pasta. Brown rice. Orpah Cobb. Bulgur. Whole-grain and low-sodium cereals. Pita bread. Low-fat, low-sodium crackers. Whole-wheat flour tortillas. Vegetables Fresh or frozen vegetables (raw, steamed, roasted, or grilled). Low-sodium or reduced-sodium tomato and vegetable juice. Low-sodium or reduced-sodium tomato sauce and tomato paste. Low-sodium or reduced-sodium canned vegetables. Fruits All fresh, dried, or frozen fruit. Canned fruit in natural juice (without added sugar). Meat and other protein foods Skinless chicken or [place]. Ground chicken or [place]. Pork with fat trimmed off. Fish and seafood. Egg whites. Dried beans, peas, or lentils. Unsalted nuts, nut butters, and seeds. Unsalted canned beans. Lean cuts of  beef with fat trimmed off. Low-sodium, lean deli meat. Dairy Low-fat (1%) or fat-free (skim) milk. Fat-free, low-fat, or reduced-fat cheeses. Nonfat, low-sodium ricotta or cottage cheese. Low-fat or nonfat yogurt. Low-fat, low-sodium cheese. Fats and oils Soft margarine without trans fats. Vegetable oil. Low-fat, reduced-fat, or light mayonnaise and salad dressings (reduced-sodium). Canola, safflower, olive, soybean, and sunflower oils. Avocado. Seasoning and other foods Herbs. Spices. Seasoning mixes without salt. Unsalted popcorn and pretzels. Fat-free sweets. What foods are not recommended? The items listed may not be a complete list. Talk with your dietitian about what dietary choices are best for you. Grains Baked goods made with fat, such as croissants, muffins, or some breads. Dry pasta or rice meal packs. Vegetables Creamed or fried vegetables. Vegetables in a cheese sauce. Regular canned vegetables (not low-sodium or reduced-sodium). Regular canned tomato sauce and paste (not low-sodium or reduced-sodium). Regular tomato and vegetable juice (not low-sodium or reduced-sodium). Rosita Fire. Olives. Fruits Canned fruit in a light or heavy syrup. Fried fruit. Fruit in cream or butter sauce. Meat and other protein foods Fatty cuts of meat. Ribs. Fried meat. Tomasa Blase. Sausage. Bologna and other processed lunch meats. Salami. Fatback. Hotdogs. Bratwurst. Salted nuts and seeds. Canned beans with added salt. Canned or smoked fish. Whole eggs or egg yolks. Chicken or [place] with skin. Dairy Whole or 2% milk, cream, and half-and-half. Whole or full-fat cream cheese. Whole-fat or sweetened yogurt. Full-fat cheese. Nondairy creamers. Whipped toppings. Processed cheese and cheese spreads. Fats and oils Butter. Stick margarine. Lard. Shortening. Ghee. Bacon fat. Tropical oils, such as coconut, palm kernel, or palm oil. Seasoning and other foods Salted popcorn and pretzels. Onion salt, garlic salt, seasoned  salt, table salt, and sea salt. Worcestershire sauce. Tartar sauce. Barbecue sauce. Teriyaki sauce. Soy sauce, including reduced-sodium. Steak sauce. Canned and packaged gravies. Fish sauce. Oyster sauce. Cocktail sauce. Horseradish that you find on the shelf. Ketchup. Mustard. Meat flavorings and tenderizers. Bouillon cubes. Hot sauce and Tabasco sauce. Premade or packaged marinades. Premade or packaged taco seasonings. Relishes. Regular salad dressings. Where to find more information:  National Heart, Lung, and Blood Institute: PopSteam.is  American Heart Association: www.heart.org Summary  The DASH eating plan is a healthy eating plan that has been shown to reduce high blood pressure (hypertension). It may also reduce your risk for type 2 diabetes, heart disease, and stroke.  With the DASH eating plan, you should limit salt (sodium) intake to 2,300 mg a day. If you have hypertension, you may need to reduce your sodium intake to 1,500 mg a day.  When on the DASH eating plan, aim to eat more fresh fruits and vegetables, whole grains, lean proteins, low-fat dairy, and  heart-healthy fats.  Work with your health care provider or diet and nutrition specialist (dietitian) to adjust your eating plan to your individual calorie needs. This information is not intended to replace advice given to you by your health care provider. Make sure you discuss any questions you have with your health care provider. Document Released: 05/23/2011 Document Revised: 05/27/2016 Document Reviewed: 05/27/2016 Elsevier Interactive Patient Education  2018 [employer].  Hypertension Hypertension, commonly called high blood pressure, is when the force of blood pumping through the arteries is too strong. The arteries are the blood vessels that carry blood from the heart throughout the body. Hypertension forces the heart to work harder to pump blood and may cause arteries to become narrow or stiff. Having untreated or  uncontrolled hypertension can cause heart attacks, strokes, kidney disease, and other problems. A blood pressure reading consists of a higher number over a lower number. Ideally, your blood pressure should be below 120/80. The first ("top") number is called the systolic pressure. It is a measure of the pressure in your arteries as your heart beats. The second ("bottom") number is called the diastolic pressure. It is a measure of the pressure in your arteries as the heart relaxes. What are the causes? The cause of this condition is not known. What increases the risk? Some risk factors for high blood pressure are under your control. Others are not. Factors you can change  Smoking.  Having type 2 diabetes mellitus, high cholesterol, or both.  Not getting enough exercise or physical activity.  Being overweight.  Having too much fat, sugar, calories, or salt (sodium) in your diet.  Drinking too much alcohol. Factors that are difficult or impossible to change  Having chronic kidney disease.  Having a family history of high blood pressure.  Age. Risk increases with age.  Race. You may be at higher risk if you are African-American.  Gender. Men are at higher risk than women before age 91. After age 11, women are at higher risk than men.  Having obstructive sleep apnea.  Stress. What are the signs or symptoms? Extremely high blood pressure (hypertensive crisis) may cause:  Headache.  Anxiety.  Shortness of breath.  Nosebleed.  Nausea and vomiting.  Severe chest pain.  Jerky movements you cannot control (seizures).  How is this diagnosed? This condition is diagnosed by measuring your blood pressure while you are seated, with your arm resting on a surface. The cuff of the blood pressure monitor will be placed directly against the skin of your upper arm at the level of your heart. It should be measured at least twice using the same arm. Certain conditions can cause a  difference in blood pressure between your right and left arms. Certain factors can cause blood pressure readings to be lower or higher than normal (elevated) for a short period of time:  When your blood pressure is higher when you are in a health care provider's office than when you are at home, this is called white coat hypertension. Most people with this condition do not need medicines.  When your blood pressure is higher at home than when you are in a health care provider's office, this is called masked hypertension. Most people with this condition may need medicines to control blood pressure.  If you have a high blood pressure reading during one visit or you have normal blood pressure with other risk factors:  You may be asked to return on a different day to have your blood pressure  checked again.  You may be asked to monitor your blood pressure at home for 1 week or longer.  If you are diagnosed with hypertension, you may have other blood or imaging tests to help your health care provider understand your overall risk for other conditions. How is this treated? This condition is treated by making healthy lifestyle changes, such as eating healthy foods, exercising more, and reducing your alcohol intake. Your health care provider may prescribe medicine if lifestyle changes are not enough to get your blood pressure under control, and if:  Your systolic blood pressure is above 130.  Your diastolic blood pressure is above 80.  Your personal target blood pressure may vary depending on your medical conditions, your age, and other factors. Follow these instructions at home: Eating and drinking  Eat a diet that is high in fiber and potassium, and low in sodium, added sugar, and fat. An example eating plan is called the DASH (Dietary Approaches to Stop Hypertension) diet. To eat this way: ? Eat plenty of fresh fruits and vegetables. Try to fill half of your plate at each meal with fruits and  vegetables. ? Eat whole grains, such as whole wheat pasta, brown rice, or whole grain bread. Fill about one quarter of your plate with whole grains. ? Eat or drink low-fat dairy products, such as skim milk or low-fat yogurt. ? Avoid fatty cuts of meat, processed or cured meats, and poultry with skin. Fill about one quarter of your plate with lean proteins, such as fish, chicken without skin, beans, eggs, and tofu. ? Avoid premade and processed foods. These tend to be higher in sodium, added sugar, and fat.  Reduce your daily sodium intake. Most people with hypertension should eat less than 1,500 mg of sodium a day.  Limit alcohol intake to no more than 1 drink a day for nonpregnant women and 2 drinks a day for men. One drink equals 12 oz of beer, 5 oz of wine, or 1 oz of hard liquor. Lifestyle  Work with your health care provider to maintain a healthy body weight or to lose weight. Ask what an ideal weight is for you.  Get at least 30 minutes of exercise that causes your heart to beat faster (aerobic exercise) most days of the week. Activities may include walking, swimming, or biking.  Include exercise to strengthen your muscles (resistance exercise), such as pilates or lifting weights, as part of your weekly exercise routine. Try to do these types of exercises for 30 minutes at least 3 days a week.  Do not use any products that contain nicotine or tobacco, such as cigarettes and e-cigarettes. If you need help quitting, ask your health care provider.  Monitor your blood pressure at home as told by your health care provider.  Keep all follow-up visits as told by your health care provider. This is important. Medicines  Take over-the-counter and prescription medicines only as told by your health care provider. Follow directions carefully. Blood pressure medicines must be taken as prescribed.  Do not skip doses of blood pressure medicine. Doing this puts you at risk for problems and can make  the medicine less effective.  Ask your health care provider about side effects or reactions to medicines that you should watch for. Contact a health care provider if:  You think you are having a reaction to a medicine you are taking.  You have headaches that keep coming back (recurring).  You feel dizzy.  You have swelling in  your ankles.  You have trouble with your vision. Get help right away if:  You develop a severe headache or confusion.  You have unusual weakness or numbness.  You feel faint.  You have severe pain in your chest or abdomen.  You vomit repeatedly.  You have trouble breathing. Summary  Hypertension is when the force of blood pumping through your arteries is too strong. If this condition is not controlled, it may put you at risk for serious complications.  Your personal target blood pressure may vary depending on your medical conditions, your age, and other factors. For most people, a normal blood pressure is less than 120/80.  Hypertension is treated with lifestyle changes, medicines, or a combination of both. Lifestyle changes include weight loss, eating a healthy, low-sodium diet, exercising more, and limiting alcohol. This information is not intended to replace advice given to you by your health care provider. Make sure you discuss any questions you have with your health care provider. Document Released: 06/03/2005 Document Revised: 05/01/2016 Document Reviewed: 05/01/2016 Elsevier Interactive Patient Education  [employer].

## 2017-12-04 NOTE — Progress Notes (Addendum)
Chief Complaint  Patient presents with  . Establish Care   New patient  1. HTN elevated 156/100 on hyzaar 100-12.5 and adipex had BP x 15 years but on meds since 2010  2. HLD declines lipitor due to side effects  3. Overweight lost 23 lbs on adipex been on on and off x 1 year  4. Elevated lfts will w/u with US abdomen this has been elevated over years    Review of Systems  Constitutional: Negative for weight loss.  HENT: Negative for hearing loss.   Eyes: Negative for blurred vision.  Respiratory: Negative for shortness of breath.   Cardiovascular: Negative for chest pain.  Gastrointestinal: Negative for abdominal pain.  Musculoskeletal: Negative for falls.  Skin: Negative for rash.  Neurological: Negative for headaches.  Psychiatric/Behavioral: Negative for depression.   Past Medical History:  Diagnosis Date  . ADHD (attention deficit hyperactivity disorder) 07/16/2016  . Anxiety   . Elevated cholesterol with high triglycerides 07/17/2017  . GERD (gastroesophageal reflux disease) 07/16/2016  . Hypertension    Past Surgical History:  Procedure Laterality Date  . BREAST ENHANCEMENT SURGERY     Family History  Problem Relation Age of Onset  . Thyroid disease Mother   . Diabetes Father   . Hypertension Father   . Heart disease Maternal Grandmother   . Heart disease Maternal Grandfather    Social History   Socioeconomic History  . Marital status: Married    Spouse name: Not on file  . Number of children: Not on file  . Years of education: Not on file  . Highest education level: Not on file  Occupational History  . Not on file  Social Needs  . Financial resource strain: Not on file  . Food insecurity:    Worry: Not on file    Inability: Not on file  . Transportation needs:    Medical: Not on file    Non-medical: Not on file  Tobacco Use  . Smoking status: Light Tobacco Smoker    Packs/day: 0.25    Years: 5.00    Pack years: 1.25    Types: Cigarettes  .  Smokeless tobacco: Never Used  Substance and Sexual Activity  . Alcohol use: Yes    Comment: socially  . Drug use: No  . Sexual activity: Yes    Birth control/protection: Post-menopausal  Lifestyle  . Physical activity:    Days per week: Patient refused    Minutes per session: Patient refused  . Stress: Not at all  Relationships  . Social connections:    Talks on phone: Patient refused    Gets together: Patient refused    Attends religious service: Patient refused    Active member of club or organization: Patient refused    Attends meetings of clubs or organizations: Patient refused    Relationship status: Patient refused  . Intimate partner violence:    Fear of current or ex partner: Patient refused    Emotionally abused: Patient refused    Physically abused: Patient refused    Forced sexual activity: Patient refused  Other Topics Concern  . Not on file  Social History Narrative  . Not on file   Current Meds  Medication Sig  . ALPRAZolam (XANAX) 0.25 MG tablet Take 1 tablet (0.25 mg total) by mouth 3 (three) times daily as needed for anxiety.  . calcium carbonate (CALCIUM 600) 600 MG TABS tablet Take by mouth.  . cetirizine (ZYRTEC) 10 MG tablet Take by mouth.  Marland Kitchen  cyanocobalamin (,VITAMIN B-12,) 1000 MCG/ML injection Inject 1 mL (1,000 mcg total) into the muscle every 30 (thirty) days.  . lansoprazole (PREVACID) 30 MG capsule TAKE 1 CAPSULE EVERY DAY  . losartan-hydrochlorothiazide (HYZAAR) 100-12.5 MG tablet TAKE 1 TABLET BY MOUTH DAILY  . PARoxetine (PAXIL) 20 MG tablet Take 1 tablet (20 mg total) by mouth daily.  . phentermine (ADIPEX-P) 37.5 MG tablet Take 1 tablet (37.5 mg total) by mouth daily before breakfast.   Allergies  Allergen Reactions  . Sulfa Antibiotics Hives   No results found for this or any previous visit (from the past 2160 hour(s)). Objective  Body mass index is 25.39 kg/m. Wt Readings from Last 3 Encounters:  12/04/17 152 lb 9.6 oz (69.2 kg)   11/17/17 154 lb 14.4 oz (70.3 kg)  09/30/17 153 lb 6.4 oz (69.6 kg)   Temp Readings from Last 3 Encounters:  12/04/17 98.7 F (37.1 C) (Oral)   BP Readings from Last 3 Encounters:  12/04/17 (!) 156/100  11/17/17 (!) 160/90  09/30/17 115/75   Pulse Readings from Last 3 Encounters:  12/04/17 (!) 101  11/17/17 81  09/30/17 90    Physical Exam  Constitutional: She is oriented to person, place, and time. She appears well-developed and well-nourished. She is cooperative.  HENT:  Head: Normocephalic and atraumatic.  Mouth/Throat: Oropharynx is clear and moist and mucous membranes are normal.  Eyes: Pupils are equal, round, and reactive to light. Conjunctivae are normal.  Cardiovascular: Normal rate, regular rhythm and normal heart sounds.  Pulmonary/Chest: Effort normal and breath sounds normal.  Neurological: She is alert and oriented to person, place, and time. Gait normal.  Skin: Skin is warm, dry and intact.  Psychiatric: She has a normal mood and affect. Her speech is normal and behavior is normal. Judgment and thought content normal. Cognition and memory are normal.  Nursing note and vitals reviewed.   Assessment   1. HTN  2. HLD  3. Slightly overweight  4. Elevated lfts r/o fatty liver  5. HM Plan   1.  Increase hyzaar to 100/25  Stop adipex  Reviewed stress test 07/05/15 negative and echo mild TR  2. Declines statin lipitor  Trial of tricor consider zetia or repatha declines statin  3. Cont exercise and healthy diet choices  4. US ab repeat lab s 5.  Had flu 2018  Had Tdap today  Smokes sometime Referred mammo  Pap had 2018 Dr. Cherly Hensenousins in [place]  -obtained had 06/27/15 negative pap hpv negative   Consider colonoscopy soon and GI referral for elevated lfts  -give info colonoscopy in future   [facility]iology Dr. [facility]  Dentist Dr. [place]  Dermatology  Dr. Roseanne KaufmanIsenstein had biopsies of milia and nevi normal follows yearly   Provider: Dr. French Anaracy  [facility]

## 2017-12-04 NOTE — Progress Notes (Signed)
Pre visit review using our clinic review tool, if applicable. No additional management support is needed unless otherwise documented below in the visit note. 

## 2017-12-08 ENCOUNTER — Encounter (INDEPENDENT_AMBULATORY_CARE_PROVIDER_SITE_OTHER): Payer: Self-pay

## 2017-12-11 ENCOUNTER — Other Ambulatory Visit: Payer: Self-pay | Admitting: Internal Medicine

## 2017-12-11 ENCOUNTER — Other Ambulatory Visit (INDEPENDENT_AMBULATORY_CARE_PROVIDER_SITE_OTHER): Payer: BLUE CROSS/BLUE SHIELD

## 2017-12-11 DIAGNOSIS — R7989 Other specified abnormal findings of blood chemistry: Secondary | ICD-10-CM | POA: Diagnosis not present

## 2017-12-11 DIAGNOSIS — Z1159 Encounter for screening for other viral diseases: Secondary | ICD-10-CM

## 2017-12-11 DIAGNOSIS — Z1329 Encounter for screening for other suspected endocrine disorder: Secondary | ICD-10-CM

## 2017-12-11 DIAGNOSIS — E785 Hyperlipidemia, unspecified: Secondary | ICD-10-CM | POA: Diagnosis not present

## 2017-12-11 DIAGNOSIS — E559 Vitamin D deficiency, unspecified: Secondary | ICD-10-CM

## 2017-12-11 DIAGNOSIS — I10 Essential (primary) hypertension: Secondary | ICD-10-CM | POA: Diagnosis not present

## 2017-12-11 DIAGNOSIS — Z0184 Encounter for antibody response examination: Secondary | ICD-10-CM | POA: Diagnosis not present

## 2017-12-11 DIAGNOSIS — R748 Abnormal levels of other serum enzymes: Secondary | ICD-10-CM | POA: Diagnosis not present

## 2017-12-11 DIAGNOSIS — Z1389 Encounter for screening for other disorder: Secondary | ICD-10-CM

## 2017-12-11 LAB — CBC WITH DIFFERENTIAL/PLATELET
BASOS PCT: 0.9 % (ref 0.0–3.0)
Basophils Absolute: 0.1 10*3/uL (ref 0.0–0.1)
EOS ABS: 0.1 10*3/uL (ref 0.0–0.7)
Eosinophils Relative: 2.1 % (ref 0.0–5.0)
HEMATOCRIT: 43 % (ref 36.0–46.0)
Hemoglobin: 15 g/dL (ref 12.0–15.0)
LYMPHS ABS: 2.7 10*3/uL (ref 0.7–4.0)
Lymphocytes Relative: 42.8 % (ref 12.0–46.0)
MCHC: 34.8 g/dL (ref 30.0–36.0)
MCV: 100.3 fl — ABNORMAL HIGH (ref 78.0–100.0)
MONO ABS: 0.6 10*3/uL (ref 0.1–1.0)
Monocytes Relative: 9.1 % (ref 3.0–12.0)
NEUTROS ABS: 2.8 10*3/uL (ref 1.4–7.7)
NEUTROS PCT: 45.1 % (ref 43.0–77.0)
PLATELETS: 105 10*3/uL — AB (ref 150.0–400.0)
RBC: 4.29 Mil/uL (ref 3.87–5.11)
RDW: 12.4 % (ref 11.5–15.5)
WBC: 6.3 10*3/uL (ref 4.0–10.5)

## 2017-12-11 LAB — T4, FREE: FREE T4: 0.69 ng/dL (ref 0.60–1.60)

## 2017-12-11 LAB — COMPREHENSIVE METABOLIC PANEL
ALT: 47 U/L — ABNORMAL HIGH (ref 0–35)
AST: 61 U/L — AB (ref 0–37)
Albumin: 4.2 g/dL (ref 3.5–5.2)
Alkaline Phosphatase: 101 U/L (ref 39–117)
BUN: 11 mg/dL (ref 6–23)
CALCIUM: 10 mg/dL (ref 8.4–10.5)
CHLORIDE: 97 meq/L (ref 96–112)
CO2: 30 meq/L (ref 19–32)
CREATININE: 0.86 mg/dL (ref 0.40–1.20)
GFR: 74.32 mL/min (ref >60.00)
Glucose, Bld: 112 mg/dL — ABNORMAL HIGH (ref 70–99)
Potassium: 4.1 mEq/L (ref 3.5–5.1)
Sodium: 136 mEq/L (ref 135–145)
Total Bilirubin: 0.7 mg/dL (ref 0.2–1.2)
Total Protein: 7.2 g/dL (ref 6.0–8.3)

## 2017-12-11 LAB — LDL CHOLESTEROL, DIRECT: Direct LDL: 94 mg/dL

## 2017-12-11 LAB — LIPID PANEL
CHOL/HDL RATIO: 8
Cholesterol: 285 mg/dL — ABNORMAL HIGH (ref 0–200)
HDL: 34.8 mg/dL — AB (ref >39.00)
Triglycerides: 608 mg/dL — ABNORMAL HIGH (ref 0.0–149.0)

## 2017-12-11 LAB — TSH: TSH: 8.39 u[IU]/mL — AB (ref 0.35–4.50)

## 2017-12-11 LAB — VITAMIN D 25 HYDROXY (VIT D DEFICIENCY, FRACTURES): VITD: 45.96 ng/mL (ref 30.00–100.00)

## 2017-12-11 NOTE — Addendum Note (Signed)
Addended by: Warden FillersWRIGHT, Aylen Stradford S on: 12/11/2017 08:17 AM   Modules accepted: Orders

## 2017-12-12 ENCOUNTER — Ambulatory Visit: Payer: BLUE CROSS/BLUE SHIELD

## 2017-12-12 ENCOUNTER — Other Ambulatory Visit (INDEPENDENT_AMBULATORY_CARE_PROVIDER_SITE_OTHER): Payer: BLUE CROSS/BLUE SHIELD

## 2017-12-12 DIAGNOSIS — R7989 Other specified abnormal findings of blood chemistry: Secondary | ICD-10-CM

## 2017-12-12 LAB — URINALYSIS, ROUTINE W REFLEX MICROSCOPIC

## 2017-12-12 LAB — T3, FREE: T3, Free: 4.7 pg/mL — ABNORMAL HIGH (ref 2.3–4.2)

## 2017-12-15 ENCOUNTER — Encounter: Payer: Self-pay | Admitting: Internal Medicine

## 2017-12-15 LAB — IRON,TIBC AND FERRITIN PANEL
%SAT: 51 % (calc) — ABNORMAL HIGH (ref 16–45)
FERRITIN: 1744 ng/mL — AB (ref 16–232)
IRON: 127 ug/dL (ref 40–190)
TIBC: 250 mcg/dL (calc) (ref 250–450)

## 2017-12-15 LAB — MEASLES/MUMPS/RUBELLA IMMUNITY
MUMPS IGG: 258 [AU]/ml
RUBELLA: 8.19 {index}
Rubeola IgG: >300 AU/mL

## 2017-12-17 ENCOUNTER — Other Ambulatory Visit: Payer: Self-pay | Admitting: Internal Medicine

## 2017-12-17 DIAGNOSIS — R946 Abnormal results of thyroid function studies: Secondary | ICD-10-CM

## 2017-12-22 ENCOUNTER — Ambulatory Visit: Payer: BLUE CROSS/BLUE SHIELD | Admitting: Obstetrics and Gynecology

## 2017-12-23 ENCOUNTER — Other Ambulatory Visit (INDEPENDENT_AMBULATORY_CARE_PROVIDER_SITE_OTHER): Payer: BLUE CROSS/BLUE SHIELD

## 2017-12-23 DIAGNOSIS — R7989 Other specified abnormal findings of blood chemistry: Secondary | ICD-10-CM | POA: Diagnosis not present

## 2017-12-24 LAB — THYROID PEROXIDASE ANTIBODY: THYROID PEROXIDASE ANTIBODY: 1 [IU]/mL (ref <9)

## 2017-12-29 ENCOUNTER — Encounter: Payer: Self-pay | Admitting: *Deleted

## 2017-12-29 ENCOUNTER — Encounter (INDEPENDENT_AMBULATORY_CARE_PROVIDER_SITE_OTHER): Payer: Self-pay

## 2017-12-30 ENCOUNTER — Encounter: Payer: Self-pay | Admitting: Internal Medicine

## 2017-12-30 ENCOUNTER — Telehealth: Payer: Self-pay | Admitting: Internal Medicine

## 2017-12-30 NOTE — Telephone Encounter (Signed)
Please get me a copy of hemachromatosis testing  Please come in and fill out release of records for that and who did it so I may get records.   Thanks [employer]

## 2018-01-01 NOTE — Telephone Encounter (Signed)
mychart message has been sent

## 2018-01-02 ENCOUNTER — Telehealth: Payer: Self-pay | Admitting: Internal Medicine

## 2018-01-02 NOTE — Telephone Encounter (Signed)
Called pt to disc lab results 07/09/2010 for hereditary hemochromotosis carrier + single mutation C282Y +  Will rec she f/u with GI for elevated lfts and iron levels again as I have previously Cancer center wanted to do phlebotomy in 2012 if + my rec. Will be f/u GI to decide if needs blood drained if so will need to establish again with cancer center for periodic blood drainage to get iron levels down too high can be dangerous  TMS

## 2018-01-05 NOTE — Telephone Encounter (Signed)
Mychart message has been sent top patient to inform them.

## 2018-01-07 NOTE — Telephone Encounter (Signed)
FYI

## 2018-01-07 NOTE — Telephone Encounter (Signed)
Patient returning call to Dr Shirlee LatchMcLean. Informed her that a message was sent to her via MyChart regarding that phone call, but I would also let Dr Shirlee LatchMcLean know that she was returning her call. Please advise.

## 2018-03-02 ENCOUNTER — Encounter: Payer: Self-pay | Admitting: Internal Medicine

## 2018-03-03 ENCOUNTER — Other Ambulatory Visit: Payer: Self-pay | Admitting: Internal Medicine

## 2018-03-03 DIAGNOSIS — D696 Thrombocytopenia, unspecified: Secondary | ICD-10-CM

## 2018-03-03 DIAGNOSIS — E785 Hyperlipidemia, unspecified: Secondary | ICD-10-CM

## 2018-03-03 DIAGNOSIS — R748 Abnormal levels of other serum enzymes: Secondary | ICD-10-CM

## 2018-03-03 DIAGNOSIS — Z1389 Encounter for screening for other disorder: Secondary | ICD-10-CM

## 2018-03-05 ENCOUNTER — Other Ambulatory Visit (INDEPENDENT_AMBULATORY_CARE_PROVIDER_SITE_OTHER): Payer: BLUE CROSS/BLUE SHIELD

## 2018-03-05 DIAGNOSIS — D696 Thrombocytopenia, unspecified: Secondary | ICD-10-CM

## 2018-03-05 DIAGNOSIS — R748 Abnormal levels of other serum enzymes: Secondary | ICD-10-CM

## 2018-03-05 DIAGNOSIS — E785 Hyperlipidemia, unspecified: Secondary | ICD-10-CM | POA: Diagnosis not present

## 2018-03-05 DIAGNOSIS — Z1389 Encounter for screening for other disorder: Secondary | ICD-10-CM

## 2018-03-05 LAB — HEPATIC FUNCTION PANEL
ALT: 92 U/L — AB (ref 0–35)
AST: 154 U/L — ABNORMAL HIGH (ref 0–37)
Albumin: 3.7 g/dL (ref 3.5–5.2)
Alkaline Phosphatase: 86 U/L (ref 39–117)
BILIRUBIN DIRECT: 0.3 mg/dL (ref 0.0–0.3)
BILIRUBIN TOTAL: 1 mg/dL (ref 0.2–1.2)
TOTAL PROTEIN: 6.4 g/dL (ref 6.0–8.3)

## 2018-03-05 LAB — CBC WITH DIFFERENTIAL/PLATELET
BASOS PCT: 1.1 % (ref 0.0–3.0)
Basophils Absolute: 0.1 10*3/uL (ref 0.0–0.1)
Eosinophils Absolute: 0.1 10*3/uL (ref 0.0–0.7)
Eosinophils Relative: 1.8 % (ref 0.0–5.0)
HEMATOCRIT: 39.4 % (ref 36.0–46.0)
HEMOGLOBIN: 13.7 g/dL (ref 12.0–15.0)
LYMPHS PCT: 49.2 % — AB (ref 12.0–46.0)
Lymphs Abs: 3 10*3/uL (ref 0.7–4.0)
MCHC: 34.9 g/dL (ref 30.0–36.0)
MCV: 100.7 fl — ABNORMAL HIGH (ref 78.0–100.0)
MONOS PCT: 7.6 % (ref 3.0–12.0)
Monocytes Absolute: 0.5 10*3/uL (ref 0.1–1.0)
NEUTROS ABS: 2.5 10*3/uL (ref 1.4–7.7)
Neutrophils Relative %: 40.3 % — ABNORMAL LOW (ref 43.0–77.0)
PLATELETS: 134 10*3/uL — AB (ref 150.0–400.0)
RBC: 3.91 Mil/uL (ref 3.87–5.11)
RDW: 12.3 % (ref 11.5–15.5)
WBC: 6.1 10*3/uL (ref 4.0–10.5)

## 2018-03-05 LAB — URINALYSIS, ROUTINE W REFLEX MICROSCOPIC
KETONES UR: NEGATIVE
Nitrite: NEGATIVE
Specific Gravity, Urine: 1.025 (ref 1.000–1.030)
UROBILINOGEN UA: 0.2 (ref 0.0–1.0)
Urine Glucose: NEGATIVE
pH: 6 (ref 5.0–8.0)

## 2018-03-05 LAB — LIPID PANEL
Cholesterol: 206 mg/dL — ABNORMAL HIGH (ref 0–200)
HDL: 29.1 mg/dL — ABNORMAL LOW (ref >39.00)
Total CHOL/HDL Ratio: 7
Triglycerides: 482 mg/dL — ABNORMAL HIGH (ref 0.0–149.0)

## 2018-03-05 LAB — LDL CHOLESTEROL, DIRECT: LDL DIRECT: 95 mg/dL

## 2018-03-05 NOTE — Addendum Note (Signed)
Addended by: Penne LashWIGGINS, Alaiah Lundy N on: 03/05/2018 08:02 AM   Modules accepted: Orders

## 2018-03-10 ENCOUNTER — Ambulatory Visit: Payer: BLUE CROSS/BLUE SHIELD | Admitting: Internal Medicine

## 2018-03-10 ENCOUNTER — Other Ambulatory Visit: Payer: Self-pay | Admitting: Internal Medicine

## 2018-03-10 DIAGNOSIS — E785 Hyperlipidemia, unspecified: Secondary | ICD-10-CM

## 2018-03-10 MED ORDER — EZETIMIBE 10 MG PO TABS: 10.0000 mg | ORAL_TABLET | Freq: Every day | ORAL | 3 refills | Status: DC

## 2018-03-17 DIAGNOSIS — E039 Hypothyroidism, unspecified: Secondary | ICD-10-CM | POA: Diagnosis not present

## 2018-04-17 ENCOUNTER — Ambulatory Visit: Payer: BLUE CROSS/BLUE SHIELD | Admitting: Internal Medicine

## 2018-04-17 ENCOUNTER — Encounter: Payer: Self-pay | Admitting: Internal Medicine

## 2018-04-17 VITALS — BP 124/80 | HR 73 | Temp 98.6°F | Ht 65.0 in | Wt 161.4 lb

## 2018-04-17 DIAGNOSIS — Z1211 Encounter for screening for malignant neoplasm of colon: Secondary | ICD-10-CM | POA: Diagnosis not present

## 2018-04-17 DIAGNOSIS — R7989 Other specified abnormal findings of blood chemistry: Secondary | ICD-10-CM

## 2018-04-17 DIAGNOSIS — E785 Hyperlipidemia, unspecified: Secondary | ICD-10-CM | POA: Diagnosis not present

## 2018-04-17 DIAGNOSIS — R946 Abnormal results of thyroid function studies: Secondary | ICD-10-CM | POA: Diagnosis not present

## 2018-04-17 DIAGNOSIS — R748 Abnormal levels of other serum enzymes: Secondary | ICD-10-CM

## 2018-04-17 DIAGNOSIS — Z148 Genetic carrier of other disease: Secondary | ICD-10-CM

## 2018-04-17 DIAGNOSIS — I10 Essential (primary) hypertension: Secondary | ICD-10-CM

## 2018-04-17 NOTE — Patient Instructions (Addendum)
07/09/2010 labs for hereditary hemochromotosis showed you were carrier positive single mutation C282Y Positive.  -read about about on web [facility] or [facility]   sch mammogram b/f 06/05/18  Due pap 06/26/2018 with Dr. Cherly Hensen

## 2018-04-17 NOTE — Progress Notes (Signed)
Pre visit review using our clinic review tool, if applicable. No additional management support is needed unless otherwise documented below in the visit note. 

## 2018-04-17 NOTE — Progress Notes (Signed)
Chief Complaint  Patient presents with  . Follow-up   F/u  1. H/o abnormal lfts, increased ferritin and iron sat and carrier positive single mutation C282Y Positive Pt declines to see H/o agreeable to see GI for screening colonoscopy and will disc  2. HTN improved on hyzaar 100-25 mg qd controlled  3. HLD zetia 10 mg been on x 6 weeks reviewed side effects  4. Elevated TSH, TSH 3.612 with endocrine and will f/u 09/16/2018 with endocrine no medications indicated at this time    Review of Systems  Constitutional: Negative for weight loss.  HENT: Negative for hearing loss.   Eyes: Negative for blurred vision.  Respiratory: Negative for shortness of breath.   Cardiovascular: Negative for chest pain.  Skin: Negative for rash.  Neurological: Negative for headaches.  Psychiatric/Behavioral: Negative for depression.   Past Medical History:  Diagnosis Date  . ADHD (attention deficit hyperactivity disorder) 07/16/2016  . Anxiety   . Elevated cholesterol with high triglycerides 07/17/2017  . GERD (gastroesophageal reflux disease) 07/16/2016  . Hot flashes   . Hypertension    since 2004 on meds since 2010   Past Surgical History:  Procedure Laterality Date  . BREAST ENHANCEMENT SURGERY     Family History  Problem Relation Age of Onset  . Thyroid disease Mother        hyperthyroidism  . Diabetes Father   . Hypertension Father   . Hyperlipidemia Father   . Heart disease Maternal Grandmother   . Depression Maternal Grandmother   . Heart disease Maternal Grandfather    Social History   Socioeconomic History  . Marital status: Married    Spouse name: Not on file  . Number of children: Not on file  . Years of education: Not on file  . Highest education level: Not on file  Occupational History  . Not on file  Social Needs  . Financial resource strain: Not on file  . Food insecurity:    Worry: Not on file    Inability: Not on file  . Transportation needs:    Medical: Not on file     Non-medical: Not on file  Tobacco Use  . Smoking status: Light Tobacco Smoker    Packs/day: 0.25    Years: 5.00    Pack years: 1.25    Types: Cigarettes  . Smokeless tobacco: Never Used  Substance and Sexual Activity  . Alcohol use: Yes    Comment: socially  . Drug use: No  . Sexual activity: Yes    Birth control/protection: Post-menopausal  Lifestyle  . Physical activity:    Days per week: Patient refused    Minutes per session: Patient refused  . Stress: Not at all  Relationships  . Social connections:    Talks on phone: Patient refused    Gets together: Patient refused    Attends religious service: Patient refused    Active member of club or organization: Patient refused    Attends meetings of clubs or organizations: Patient refused    Relationship status: Patient refused  . Intimate partner violence:    Fear of current or ex partner: Patient refused    Emotionally abused: Patient refused    Physically abused: Patient refused    Forced sexual activity: Patient refused  Other Topics Concern  . 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    No guns, wears selt belt, feels safe in relationship  Current Meds  Medication Sig  . ALPRAZolam (XANAX) 0.25 MG tablet Take 1 tablet (0.25 mg total) by mouth 3 (three) times daily as needed for anxiety.  . calcium carbonate (CALCIUM 600) 600 MG TABS tablet Take by mouth.  . cetirizine (ZYRTEC) 10 MG tablet Take by mouth.  . cyanocobalamin (,VITAMIN B-12,) 1000 MCG/ML injection Inject 1 mL (1,000 mcg total) into the muscle every 30 (thirty) days.  Marland Kitchen ezetimibe (ZETIA) 10 MG tablet Take 1 tablet (10 mg total) by mouth daily.  . lansoprazole (PREVACID) 30 MG capsule TAKE 1 CAPSULE EVERY DAY  . losartan-hydrochlorothiazide (HYZAAR) 100-25 MG tablet Take 1 tablet by mouth daily.  Marland Kitchen PARoxetine (PAXIL) 20 MG tablet Take 1 tablet (20 mg total) by mouth daily.   Allergies  Allergen Reactions   . Sulfa Antibiotics Hives   Recent Results (from the past 2160 hour(s))  CBC with Differential/Platelet     Status: Abnormal   Collection Time: 03/05/18  8:02 AM  Result Value Ref Range   WBC 6.1 4.0 - 10.5 K/uL   RBC 3.91 3.87 - 5.11 Mil/uL   Hemoglobin 13.7 12.0 - 15.0 g/dL   HCT 96.0 45.4 - 09.8 %   MCV 100.7 (H) 78.0 - 100.0 fl   MCHC 34.9 30.0 - 36.0 g/dL   RDW 11.9 14.7 - 82.9 %   Platelets 134.0 (L) 150.0 - 400.0 K/uL   Neutrophils Relative % 40.3 (L) 43.0 - 77.0 %   Lymphocytes Relative 49.2 (H) 12.0 - 46.0 %   Monocytes Relative 7.6 3.0 - 12.0 %   Eosinophils Relative 1.8 0.0 - 5.0 %   Basophils Relative 1.1 0.0 - 3.0 %   Neutro Abs 2.5 1.4 - 7.7 K/uL   Lymphs Abs 3.0 0.7 - 4.0 K/uL   Monocytes Absolute 0.5 0.1 - 1.0 K/uL   Eosinophils Absolute 0.1 0.0 - 0.7 K/uL   Basophils Absolute 0.1 0.0 - 0.1 K/uL  Lipid panel     Status: Abnormal   Collection Time: 03/05/18  8:02 AM  Result Value Ref Range   Cholesterol 206 (H) 0 - 200 mg/dL    Comment: ATP III Classification       Desirable:  < 200 mg/dL               Borderline High:  200 - 239 mg/dL          High:  > = 562 mg/dL   Triglycerides (H) 0.0 - 149.0 mg/dL    130.8 Triglyceride is over 400; calculations on Lipids are invalid.    Comment: Normal:  <150 mg/dLBorderline High:  150 - 199 mg/dL   HDL 65.78 (L) >46.96 mg/dL   Total CHOL/HDL Ratio 7     Comment:                Men          Women1/2 Average Risk     3.4          3.3Average Risk          5.0          4.42X Average Risk          9.6          7.13X Average Risk          15.0          11.0                      Hepatic  function panel     Status: Abnormal   Collection Time: 03/05/18  8:02 AM  Result Value Ref Range   Total Bilirubin 1.0 0.2 - 1.2 mg/dL   Bilirubin, Direct 0.3 0.0 - 0.3 mg/dL   Alkaline Phosphatase 86 39 - 117 U/L   AST 154 (H) 0 - 37 U/L   ALT 92 (H) 0 - 35 U/L   Total Protein 6.4 6.0 - 8.3 g/dL   Albumin 3.7 3.5 - 5.2 g/dL  Urinalysis,  Routine w reflex microscopic     Status: Abnormal   Collection Time: 03/05/18  8:02 AM  Result Value Ref Range   Color, Urine YELLOW Yellow;Lt. Yellow   APPearance CLEAR Clear   Specific Gravity, Urine 1.025 1.000 - 1.030   pH 6.0 5.0 - 8.0   Total Protein, Urine TRACE (A) Negative   Urine Glucose NEGATIVE Negative   Ketones, ur NEGATIVE Negative   Bilirubin Urine MODERATE (A) Negative   Hgb urine dipstick SMALL (A) Negative   Urobilinogen, UA 0.2 0.0 - 1.0   Leukocytes, UA SMALL (A) Negative   Nitrite NEGATIVE Negative   WBC, UA 11-20/hpf (A) 0-2/hpf   RBC / HPF 3-6/hpf (A) 0-2/hpf   Mucus, UA Presence of (A) None   Squamous Epithelial / LPF Rare(0-4/hpf) Rare(0-4/hpf)   Granular Casts, UA Presence of (A) None   Hyaline Casts, UA Presence of (A) None   Ca Oxalate Crys, UA Presence of (A) None  LDL cholesterol, direct     Status: None   Collection Time: 03/05/18  8:02 AM  Result Value Ref Range   Direct LDL 95.0 mg/dL    Comment: Optimal:  <161 mg/dLNear or Above Optimal:  100-129 mg/dLBorderline High:  130-159 mg/dLHigh:  160-189 mg/dLVery High:  >190 mg/dL   Objective  Body mass index is 26.86 kg/m. Wt Readings from Last 3 Encounters:  04/17/18 161 lb 6.4 oz (73.2 kg)  12/04/17 152 lb 9.6 oz (69.2 kg)  11/17/17 154 lb 14.4 oz (70.3 kg)   Temp Readings from Last 3 Encounters:  04/17/18 98.6 F (37 C) (Oral)  12/04/17 98.7 F (37.1 C) (Oral)   BP Readings from Last 3 Encounters:  04/17/18 124/80  12/04/17 (!) 156/100  11/17/17 (!) 160/90   Pulse Readings from Last 3 Encounters:  04/17/18 73  12/04/17 (!) 101  11/17/17 81    Physical Exam  Constitutional: She is oriented to person, place, and time. Vital signs are normal. She appears well-developed and well-nourished. She is cooperative.  HENT:  Head: Normocephalic and atraumatic.  Mouth/Throat: Oropharynx is clear and moist and mucous membranes are normal.  Eyes: Pupils are equal, round, and reactive to  light. Conjunctivae are normal.  Cardiovascular: Normal rate, regular rhythm and normal heart sounds.  Pulmonary/Chest: Effort normal and breath sounds normal.  Neurological: She is alert and oriented to person, place, and time. Gait normal.  Skin: Skin is warm, dry and intact.  Psychiatric: She has a normal mood and affect. Her speech is normal and behavior is normal. Judgment and thought content normal. Cognition and memory are normal.  Nursing note and vitals reviewed.   Assessment   1. H/o abnormal lfts, increased ferritin and iron sat and carrier positive single mutation C282Y Positive noted in 2012, screening colonoscopy 2. HTN  3. HLD 4. Elevated TSH, TSH 3.612 with endocrine and will f/u 09/16/2018 with endocrine no medications indicated at this time 5. HM Plan   1. Refer to GI Dr. Servando Snare to disc  all above  Check lfts and anemia panel again  Pt declines to see h/o 2. Cont meds  3. Check lipid upcoming  On zetia 10  4. F/u endocrine 09/16/2018  5.  Had flu 2018 declines for now  Tdap utd Smokes sometime Referred mammo rec sch Pap had 2018 Dr. Cherly Hensen in [place]  -obtained had 06/27/15 negative pap hpv negative  -pt will sch with Dr. Valentino Saxon  Referred Dr. Amedeo Gory screening colonoscopy  Dermatology saw 2018-2019 Dr. Roseanne Kaufman   Cardiology Dr. Juliann Pares [place] [facility]  Dentist Dr. [place]  Dermatology [place] Dr. Roseanne Kaufman had biopsies of milia and nevi normal follows yearly    Provider: Dr. French Ana [facility]

## 2018-05-12 DIAGNOSIS — J0141 Acute recurrent pansinusitis: Secondary | ICD-10-CM | POA: Diagnosis not present

## 2018-05-19 ENCOUNTER — Ambulatory Visit
Admission: RE | Admit: 2018-05-19 | Discharge: 2018-05-19 | Disposition: A | Discharge status: Home / Self Care | Payer: BLUE CROSS/BLUE SHIELD | Source: Ambulatory Visit | Attending: Internal Medicine | Admitting: Internal Medicine

## 2018-05-19 DIAGNOSIS — Z1231 Encounter for screening mammogram for malignant neoplasm of breast: Secondary | ICD-10-CM | POA: Diagnosis not present

## 2018-06-02 ENCOUNTER — Other Ambulatory Visit: Payer: BLUE CROSS/BLUE SHIELD

## 2018-07-08 DIAGNOSIS — D225 Melanocytic nevi of trunk: Secondary | ICD-10-CM | POA: Diagnosis not present

## 2018-07-08 DIAGNOSIS — D2262 Melanocytic nevi of left upper limb, including shoulder: Secondary | ICD-10-CM | POA: Diagnosis not present

## 2018-07-08 DIAGNOSIS — D2272 Melanocytic nevi of left lower limb, including hip: Secondary | ICD-10-CM | POA: Diagnosis not present

## 2018-07-08 DIAGNOSIS — D2261 Melanocytic nevi of right upper limb, including shoulder: Secondary | ICD-10-CM | POA: Diagnosis not present

## 2018-07-15 ENCOUNTER — Ambulatory Visit: Payer: BLUE CROSS/BLUE SHIELD | Admitting: Gastroenterology

## 2018-07-22 ENCOUNTER — Encounter: Payer: BLUE CROSS/BLUE SHIELD | Admitting: Obstetrics and Gynecology

## 2018-07-24 ENCOUNTER — Other Ambulatory Visit: Payer: Self-pay | Admitting: Obstetrics and Gynecology

## 2018-07-28 ENCOUNTER — Other Ambulatory Visit: Payer: Self-pay

## 2018-07-28 MED ORDER — PAROXETINE HCL 20 MG PO TABS: 20.0000 mg | ORAL_TABLET | Freq: Every day | ORAL | 0 refills | Status: DC

## 2018-07-30 ENCOUNTER — Encounter: Payer: BLUE CROSS/BLUE SHIELD | Admitting: Obstetrics and Gynecology

## 2018-08-07 ENCOUNTER — Encounter: Payer: BLUE CROSS/BLUE SHIELD | Admitting: Obstetrics and Gynecology

## 2018-08-24 ENCOUNTER — Encounter: Payer: Self-pay | Admitting: Obstetrics and Gynecology

## 2018-08-24 ENCOUNTER — Ambulatory Visit (INDEPENDENT_AMBULATORY_CARE_PROVIDER_SITE_OTHER): Payer: BLUE CROSS/BLUE SHIELD | Admitting: Obstetrics and Gynecology

## 2018-08-24 ENCOUNTER — Other Ambulatory Visit (HOSPITAL_COMMUNITY)
Admission: RE | Admit: 2018-08-24 | Discharge: 2018-08-24 | Disposition: A | Discharge status: Home / Self Care | Payer: BLUE CROSS/BLUE SHIELD | Source: Ambulatory Visit | Attending: Obstetrics and Gynecology | Admitting: Obstetrics and Gynecology

## 2018-08-24 VITALS — BP 118/80 | HR 91 | Ht 65.0 in | Wt 173.2 lb

## 2018-08-24 DIAGNOSIS — R7989 Other specified abnormal findings of blood chemistry: Secondary | ICD-10-CM

## 2018-08-24 DIAGNOSIS — E785 Hyperlipidemia, unspecified: Secondary | ICD-10-CM

## 2018-08-24 DIAGNOSIS — Z01419 Encounter for gynecological examination (general) (routine) without abnormal findings: Secondary | ICD-10-CM

## 2018-08-24 DIAGNOSIS — I10 Essential (primary) hypertension: Secondary | ICD-10-CM

## 2018-08-24 DIAGNOSIS — N951 Menopausal and female climacteric states: Secondary | ICD-10-CM

## 2018-08-24 DIAGNOSIS — Z720 Tobacco use: Secondary | ICD-10-CM

## 2018-08-24 DIAGNOSIS — E663 Overweight: Secondary | ICD-10-CM

## 2018-08-24 NOTE — Progress Notes (Addendum)
ANNUAL PREVENTATIVE CARE GYNECOLOGY  ENCOUNTER NOTE  Subjective:       Kathleen Dennis is a 53 y.o. G1P0010 postmenopausal female here for a routine annual gynecologic exam. The patient is sexually active. The patient is not taking hormone replacement therapy. Patient denies post-menopausal vaginal bleeding. The patient wears seatbelts: yes. The patient participates in regular exercise: yes (4-5 x weekly). The patient reports that there is not domestic violence in her life.  Current complaints: 1.  Notes that she is being followed for abnormal thyroid levels.  Due for recheck at the end of the month.  2. Still noting that her hot flushes are bothersome, despite increasing her dose of Paxil last year from 10 mg to 20 mg.    Gynecologic History Patient's last menstrual period was 06/17/2013 (approximate). Contraception: post menopausal status Last Pap: 1/102017. Results were: normal Last mammogram: ~ 10/2016 (performed at [facility], with implants). Results were: normal Last colonoscopy: Patient has never had one. Scheduled for one in 1-2 months.   Obstetric History OB History  Gravida Para Term Preterm AB Living  1       1    SAB TAB Ectopic Multiple Live Births  1            # Outcome Date GA Lbr Len/2nd Weight Sex Delivery Anes PTL Lv  1 SAB 1989            Past Medical History:  Diagnosis Date  . ADHD (attention deficit hyperactivity disorder) 07/16/2016  . Anxiety   . Elevated cholesterol with high triglycerides 07/17/2017  . GERD (gastroesophageal reflux disease) 07/16/2016  . Hot flashes   . Hypertension    since 2004 on meds since 2010    Family History  Problem Relation Age of Onset  . Thyroid disease Mother        hyperthyroidism  . Diabetes Father   . Hypertension Father   . Hyperlipidemia Father   . Heart disease Maternal Grandmother   . Depression Maternal Grandmother   . Heart disease Maternal Grandfather     Past Surgical History:    Procedure Laterality Date  . AUGMENTATION MAMMAPLASTY Bilateral 1997  . BREAST ENHANCEMENT SURGERY      Social History   Socioeconomic History  . Marital status: Married    Spouse name: Not on file  . Number of children: Not on file  . Years of education: Not on file  . Highest education level: Not on file  Occupational History  . Not on file  Social Needs  . Financial resource strain: Not on file  . Food insecurity:    Worry: Not on file    Inability: Not on file  . Transportation needs:    Medical: Not on file    Non-medical: Not on file  Tobacco Use  . Smoking status: Light Tobacco Smoker    Packs/day: 0.25    Years: 5.00    Pack years: 1.25    Types: Cigarettes  . Smokeless tobacco: Never Used  Substance and Sexual Activity  . Alcohol use: Yes    Comment: socially  . Drug use: No  . Sexual activity: Yes    Birth control/protection: Post-menopausal  Lifestyle  . Physical activity:    Days per week: Patient refused    Minutes per session: Patient refused  . Stress: Not at all  Relationships  . Social connections:    Talks on phone: Patient refused    Gets together: Patient refused  Attends religious service: Patient refused    Active member of club or organization: Patient refused    Attends meetings of clubs or organizations: Patient refused    Relationship status: Patient refused  . Intimate partner violence:    Fear of current or ex partner: Patient refused    Emotionally abused: Patient refused    Physically abused: Patient refused    Forced sexual activity: Patient refused  Other Topics Concern  . 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    No guns, wears selt belt, feels safe in relationship    Current Outpatient Medications on File Prior to Visit  Medication Sig Dispense Refill  . ALPRAZolam (XANAX) 0.25 MG tablet Take 1 tablet (0.25 mg total) by mouth 3 (three) times daily as needed for anxiety.  30 tablet 2  . cetirizine (ZYRTEC) 10 MG tablet Take by mouth.    . ezetimibe (ZETIA) 10 MG tablet Take 1 tablet (10 mg total) by mouth daily. 90 tablet 3  . lansoprazole (PREVACID) 30 MG capsule TAKE 1 CAPSULE EVERY DAY 30 capsule 11  . losartan-hydrochlorothiazide (HYZAAR) 100-25 MG tablet Take 1 tablet by mouth daily. 90 tablet 3  . Multiple Vitamin (MULTI VITAMIN DAILY PO) Take by mouth.    Marland Kitchen PARoxetine (PAXIL) 20 MG tablet Take 1 tablet (20 mg total) by mouth daily. 30 tablet 0   No current facility-administered medications on file prior to visit.       Allergies  Allergen Reactions  . Sulfa Antibiotics Hives    Review of Systems ROS Review of Systems - General ROS: negative for - chills, fatigue, fever.  Positive for weight gain (notes she is working out, but stopped monitoring her diet and so has gained some weight back).   Psychological ROS: negative for - anxiety, decreased libido, depression, mood swings, physical abuse or sexual abuse Ophthalmic ROS: negative for - blurry vision, eye pain or loss of vision ENT ROS: negative for - headaches, hearing change, visual changes or vocal changes Allergy and Immunology ROS: negative for - hives, itchy/watery eyes or seasonal allergies Hematological and Lymphatic ROS: negative for - bleeding problems, bruising, swollen lymph nodes or weight loss Endocrine ROS: negative for - galactorrhea, hair pattern changes, hot flashes, malaise/lethargy, mood swings, palpitations, polydipsia/polyuria, skin changes, or unexpected weight changes.  Positive for night sweats and hot flushes.  Breast ROS: negative for - new or changing breast lumps or nipple discharge Respiratory ROS: negative for - cough or shortness of breath Cardiovascular ROS: negative for - chest pain, irregular heartbeat, palpitations or shortness of breath Gastrointestinal ROS: no abdominal pain, change in bowel habits, or black or bloody stools Genito-Urinary ROS: no dysuria,  trouble voiding, or hematuria.   Musculoskeletal ROS: negative for - joint pain or joint stiffness Neurological ROS: negative for - bowel and bladder control changes Dermatological ROS: negative for rash and skin lesion changes   Objective:   BP 118/80   Pulse 91   Ht 5\' 5"  (1.651 m)   Wt 173 lb 3.2 oz (78.6 kg)   LMP 06/17/2013 (Approximate)   BMI 28.82 kg/m    CONSTITUTIONAL: Well-developed, well-nourished female in no acute distress. Overweight.  PSYCHIATRIC: Normal mood and affect. Normal behavior. Normal judgment and thought content. NEUROLGIC: Alert and oriented to person, place, and time. Normal muscle tone coordination. No cranial nerve deficit noted. HENT:  Normocephalic, atraumatic, External right and left ear normal. Oropharynx is clear  and moist EYES: Conjunctivae and EOM are normal. Pupils are equal, round, and reactive to light. No scleral icterus.  NECK: Normal range of motion, supple, no masses.  Normal thyroid.  SKIN: Skin is warm and dry. No rash noted. Not diaphoretic. No erythema. No pallor. CARDIOVASCULAR: Normal heart rate noted, regular rhythm, no murmur. RESPIRATORY: Clear to auscultation bilaterally. Effort and breath sounds normal, no problems with respiration noted. BREASTS: Symmetric in size. No masses, skin changes, nipple drainage, or lymphadenopathy. Implants palpable.  ABDOMEN: Soft, normal bowel sounds, no distention noted.  No tenderness, rebound or guarding.  BLADDER: Normal PELVIC:  Bladder no bladder distension noted  Urethra: normal appearing urethra with no masses, tenderness or lesions  Vulva: normal appearing vulva with no masses, tenderness or lesions  Vagina: normal appearing vagina with no discharge, no lesions and mildly atrophic  Cervix: normal appearing cervix without discharge or lesions  Uterus: uterus is normal size, shape, consistency and nontender  Adnexa: normal adnexa in size, nontender and no masses  RV: External Exam NormaI,  No Rectal Masses and Normal Sphincter tone  MUSCULOSKELETAL: Normal range of motion. No tenderness.  No cyanosis, clubbing, or edema.  2+ distal pulses. LYMPHATIC: No Axillary, Supraclavicular, or Inguinal Adenopathy.    Labs:  Lab Results  Component Value Date   WBC 6.1 03/05/2018   HGB 13.7 03/05/2018   HCT 39.4 03/05/2018   MCV 100.7 (H) 03/05/2018   PLT 134.0 (L) 03/05/2018      Chemistry      Component Value Date/Time   NA 136 12/11/2017 0805   NA 140 07/17/2017 0854   K 4.1 12/11/2017 0805   CL 97 12/11/2017 0805   CO2 30 12/11/2017 0805   BUN 11 12/11/2017 0805   BUN 17 07/17/2017 0854   CREATININE 0.86 12/11/2017 0805      Component Value Date/Time   CALCIUM 10.0 12/11/2017 0805   ALKPHOS 86 03/05/2018 0802   AST 154 (H) 03/05/2018 0802   ALT 92 (H) 03/05/2018 0802   BILITOT 1.0 03/05/2018 0802   BILITOT 0.4 07/17/2017 0854      Lab Results  Component Value Date   CHOL 206 (H) 03/05/2018   HDL 29.10 (L) 03/05/2018   LDLCALC Comment 07/17/2017   LDLDIRECT 95.0 03/05/2018   TRIG (H) 03/05/2018    482.0 Triglyceride is over 400; calculations on Lipids are invalid.   CHOLHDL 7 03/05/2018    Lab Results  Component Value Date   TSH 8.39 (H) 12/11/2017      Assessment:   Annual gynecologic examination 53 y.o., postmenopausal Contraception: post menopausal status Overweight  Tobacco abuse CHTN Dyslipidemia (elevated triglycerides) Abnormal thyroid labs  Plan:  - Pap: Pap Co Test performed.  - Mammogram: Up to date. Ordered for December.  - Stool Guaiac Testing:  Not Indicated.  Will be due for her first screening colonoscopy after October 2019.  - Labs: CBC, CMP and Lipid 1.  - Routine preventative health maintenance measures emphasized: Exercise/Diet/Weight control, Tobacco Warnings, Alcohol/Substance use risks and Stress Management  - To discontinue Paxil for menopausal vasomotor symptoms as she feels it is ineffective. Discussed  risks/benefits of use of hormonal therapy vs trying another non-hormonal method. After discussion, patient ok to try bio-identical hormones. Will prescribe Bijuva.  - CHTN, currently controlled on meds. Managed by PCP.  - Dyslipidemia currently managed by PCP.  - Abnormal thyroid testing, being worked up currently.  - Declines flu vaccine.  - Tobacco abuse, cut down to 2  cig/day max.  Has not yet set a quit date but plans to. Continue to encourage cessation.  - Return to Clinic - 1 Year   Hildred Laserherry, Kathleen Esquibel, MD [facility]

## 2018-08-24 NOTE — Progress Notes (Signed)
PT is present today for her annual exam. Pt stated that she has been doing self-breast exams monthly. Pt stated that she is doing well and denies any issues. No problems or concerns.     

## 2018-08-24 NOTE — Patient Instructions (Addendum)
Weaning from Paxil:  Decrease to 1/2 tablet x 2 weeks, then decrease to 1/2 tablet every other day x 2 weeks, then discontinue.     Preventive Care 40-64 Years, Female Preventive care refers to lifestyle choices and visits with your health care provider that can promote health and wellness. What does preventive care include?   A yearly physical exam. This is also called an annual well check.  Dental exams once or twice a year.  Routine eye exams. Ask your health care provider how often you should have your eyes checked.  Personal lifestyle choices, including: ? Daily care of your teeth and gums. ? Regular physical activity. ? Eating a healthy diet. ? Avoiding tobacco and drug use. ? Limiting alcohol use. ? Practicing safe sex. ? Taking low-dose aspirin daily starting at age 77. ? Taking vitamin and mineral supplements as recommended by your health care provider. What happens during an annual well check? The services and screenings done by your health care provider during your annual well check will depend on your age, overall health, lifestyle risk factors, and family history of disease. Counseling Your health care provider may ask you questions about your:  Alcohol use.  Tobacco use.  Drug use.  Emotional well-being.  Home and relationship well-being.  Sexual activity.  Eating habits.  Work and work Statistician.  Method of birth control.  Menstrual cycle.  Pregnancy history. Screening You may have the following tests or measurements:  Height, weight, and BMI.  Blood pressure.  Lipid and cholesterol levels. These may be checked every 5 years, or more frequently if you are over 19 years old.  Skin check.  Lung cancer screening. You may have this screening every year starting at age 12 if you have a 30-pack-year history of smoking and currently smoke or have quit within the past 15 years.  Colorectal cancer screening. All adults should have this screening  starting at age 47 and continuing until age 4. Your health care provider may recommend screening at age 73. You will have tests every 1-10 years, depending on your results and the type of screening test. People at increased risk should start screening at an earlier age. Screening tests may include: ? Guaiac-based fecal occult blood testing. ? Fecal immunochemical test (FIT). ? Stool DNA test. ? Virtual colonoscopy. ? Sigmoidoscopy. During this test, a flexible tube with a tiny camera (sigmoidoscope) is used to examine your rectum and lower colon. The sigmoidoscope is inserted through your anus into your rectum and lower colon. ? Colonoscopy. During this test, a long, thin, flexible tube with a tiny camera (colonoscope) is used to examine your entire colon and rectum.  Hepatitis C blood test.  Hepatitis B blood test.  Sexually transmitted disease (STD) testing.  Diabetes screening. This is done by checking your blood sugar (glucose) after you have not eaten for a while (fasting). You may have this done every 1-3 years.  Mammogram. This may be done every 1-2 years. Talk to your health care provider about when you should start having regular mammograms. This may depend on whether you have a family history of breast cancer.  BRCA-related cancer screening. This may be done if you have a family history of breast, ovarian, tubal, or peritoneal cancers.  Pelvic exam and Pap test. This may be done every 3 years starting at age 55. Starting at age 23, this may be done every 5 years if you have a Pap test in combination with an HPV test.  Bone  density scan. This is done to screen for osteoporosis. You may have this scan if you are at high risk for osteoporosis. Discuss your test results, treatment options, and if necessary, the need for more tests with your health care provider. Vaccines Your health care provider may recommend certain vaccines, such as:  Influenza vaccine. This is recommended every  year.  Tetanus, diphtheria, and acellular pertussis (Tdap, Td) vaccine. You may need a Td booster every 10 years.  Varicella vaccine. You may need this if you have not been vaccinated.  Zoster vaccine. You may need this after age 36.  Measles, mumps, and rubella (MMR) vaccine. You may need at least one dose of MMR if you were born in 1957 or later. You may also need a second dose.  Pneumococcal 13-valent conjugate (PCV13) vaccine. You may need this if you have certain conditions and were not previously vaccinated.  Pneumococcal polysaccharide (PPSV23) vaccine. You may need one or two doses if you smoke cigarettes or if you have certain conditions.  Meningococcal vaccine. You may need this if you have certain conditions.  Hepatitis A vaccine. You may need this if you have certain conditions or if you travel or work in places where you may be exposed to hepatitis A.  Hepatitis B vaccine. You may need this if you have certain conditions or if you travel or work in places where you may be exposed to hepatitis B.  Haemophilus influenzae type b (Hib) vaccine. You may need this if you have certain conditions. Talk to your health care provider about which screenings and vaccines you need and how often you need them. This information is not intended to replace advice given to you by your health care provider. Make sure you discuss any questions you have with your health care provider. Document Released: 06/30/2015 Document Revised: 07/24/2017 Document Reviewed: 04/04/2015 Elsevier Interactive Patient Education  2019 [place] Breast self-awareness means:  Knowing how your breasts look.  Knowing how your breasts feel.  Checking your breasts every month for changes.  Telling your doctor if you notice a change in your breasts. Breast self-awareness allows you to notice a breast problem early while it is still small. How to do a breast self-exam One way to learn  what is normal for your breasts and to check for changes is to do a breast self-exam. To do a breast self-exam: Look for Changes  1. Take off all the clothes above your waist. 2. Stand in front of a mirror in a room with good lighting. 3. Put your hands on your hips. 4. Push your hands down. 5. Look at your breasts and nipples in the mirror to see if one breast or nipple looks different than the other. Check to see if: ? The shape of one breast is different. ? The size of one breast is different. ? There are wrinkles, dips, and bumps in one breast and not the other. 6. Look at each breast for changes in your skin, such as: ? Redness. ? Scaly areas. 7. Look for changes in your nipples, such as: ? Liquid around the nipples. ? Bleeding. ? Dimpling. ? Redness. ? A change in where the nipples are. Feel for Changes 1. Lie on your back on the floor. 2. Feel each breast. To do this, follow these steps: ? Pick a breast to feel. ? Put the arm closest to that breast above your head. ? Use your other arm to feel the nipple area of your  breast. Feel the area with the pads of your three middle fingers by making small circles with your fingers. For the first circle, press lightly. For the second circle, press harder. For the third circle, press even harder. ? Keep making circles with your fingers at the light, harder, and even harder pressures as you move down your breast. Stop when you feel your ribs. ? Move your fingers a little toward the center of your body. ? Start making circles with your fingers again, this time going up until you reach your collarbone. ? Keep making up and down circles until you reach your armpit. Remember to keep using the three pressures. ? Feel the other breast in the same way. 3. Sit or stand in the shower or tub. 4. With soapy water on your skin, feel each breast the same way you did in step 2, when you were lying on the floor. Write Down What You Find After doing the  self-exam, write down:  What is normal for each breast.  Any changes you find in each breast.  When you last had your period.  How often should I check my breasts? Check your breasts every month. If you are breastfeeding, the best time to check them is after you feed your baby or after you use a breast pump. If you get periods, the best time to check your breasts is 5-7 days after your period is over. When should I see my doctor? See your doctor if you notice:  A change in shape or size of your breasts or nipples.  A change in the skin of your breast or nipples, such as red or scaly skin.  Unusual fluid coming from your nipples.  A lump or thick area that was not there before.  Pain in your breasts.  Anything that concerns you. This information is not intended to replace advice given to you by your health care provider. Make sure you discuss any questions you have with your health care provider. Document Released: 11/20/2007 Document Revised: 11/09/2015 Document Reviewed: 04/23/2015 Elsevier Interactive Patient Education  2019 [employer].

## 2018-09-01 LAB — CYTOLOGY - PAP
Diagnosis: UNDETERMINED — AB
HPV: NOT DETECTED

## 2018-09-11 NOTE — Telephone Encounter (Signed)
I told her through mychart that taking the 2 should be fine.  We just need to send in the prescription for Bijuva.  There's is only 1 dose for this medication.

## 2018-09-17 ENCOUNTER — Other Ambulatory Visit: Payer: Self-pay

## 2018-09-18 ENCOUNTER — Other Ambulatory Visit: Payer: Self-pay

## 2018-09-18 MED ORDER — ESTRADIOL-PROGESTERONE 1-100 MG PO CAPS: 1.0000 mg | ORAL_CAPSULE | Freq: Every evening | ORAL | 6 refills | Status: DC

## 2018-09-29 ENCOUNTER — Other Ambulatory Visit: Payer: Self-pay | Admitting: Obstetrics and Gynecology

## 2018-10-15 ENCOUNTER — Ambulatory Visit: Payer: BLUE CROSS/BLUE SHIELD | Admitting: Gastroenterology

## 2018-10-16 ENCOUNTER — Ambulatory Visit: Payer: BLUE CROSS/BLUE SHIELD | Admitting: Internal Medicine

## 2018-11-03 ENCOUNTER — Encounter: Payer: Self-pay | Admitting: *Deleted

## 2018-11-16 ENCOUNTER — Ambulatory Visit: Payer: BLUE CROSS/BLUE SHIELD | Admitting: Gastroenterology

## 2018-12-10 ENCOUNTER — Telehealth: Payer: Self-pay | Admitting: Obstetrics and Gynecology

## 2018-12-10 MED ORDER — BIJUVA 1-100 MG PO CAPS: 100.0000 mg | ORAL_CAPSULE | Freq: Every day | ORAL | 0 refills | Status: DC

## 2018-12-10 NOTE — Telephone Encounter (Signed)
Pts insurance sent a fax stating that the pts insurance does not cover the medication Estradiol-Progesterone ([place]) 1-100 MG CAPS [[id number]]. Please advise.

## 2018-12-15 ENCOUNTER — Other Ambulatory Visit: Payer: Self-pay | Admitting: Internal Medicine

## 2018-12-15 ENCOUNTER — Other Ambulatory Visit: Payer: Self-pay

## 2018-12-15 DIAGNOSIS — N951 Menopausal and female climacteric states: Secondary | ICD-10-CM

## 2018-12-15 DIAGNOSIS — I10 Essential (primary) hypertension: Secondary | ICD-10-CM

## 2018-12-15 MED ORDER — PROGESTERONE MICRONIZED 100 MG PO CAPS: 100.0000 mg | ORAL_CAPSULE | Freq: Every day | ORAL | 6 refills | Status: DC

## 2018-12-15 MED ORDER — HYDROCHLOROTHIAZIDE 25 MG PO TABS: 25.0000 mg | ORAL_TABLET | Freq: Every day | ORAL | 3 refills | Status: DC

## 2018-12-15 MED ORDER — ESTRADIOL 1 MG PO TABS: 1.0000 mg | ORAL_TABLET | Freq: Every day | ORAL | 6 refills | Status: DC

## 2018-12-15 MED ORDER — LOSARTAN POTASSIUM 100 MG PO TABS: 100.0000 mg | ORAL_TABLET | Freq: Every day | ORAL | 3 refills | Status: DC

## 2019-01-02 ENCOUNTER — Other Ambulatory Visit: Payer: Self-pay | Admitting: Obstetrics and Gynecology

## 2019-01-13 ENCOUNTER — Encounter: Payer: Self-pay | Admitting: Internal Medicine

## 2019-01-13 ENCOUNTER — Ambulatory Visit (INDEPENDENT_AMBULATORY_CARE_PROVIDER_SITE_OTHER): Payer: BC Managed Care – PPO | Admitting: Internal Medicine

## 2019-01-13 ENCOUNTER — Other Ambulatory Visit: Payer: Self-pay

## 2019-01-13 DIAGNOSIS — R748 Abnormal levels of other serum enzymes: Secondary | ICD-10-CM | POA: Diagnosis not present

## 2019-01-13 DIAGNOSIS — I10 Essential (primary) hypertension: Secondary | ICD-10-CM

## 2019-01-13 DIAGNOSIS — E559 Vitamin D deficiency, unspecified: Secondary | ICD-10-CM

## 2019-01-13 DIAGNOSIS — Z148 Genetic carrier of other disease: Secondary | ICD-10-CM

## 2019-01-13 DIAGNOSIS — R7989 Other specified abnormal findings of blood chemistry: Secondary | ICD-10-CM

## 2019-01-13 DIAGNOSIS — R319 Hematuria, unspecified: Secondary | ICD-10-CM

## 2019-01-13 DIAGNOSIS — Z1159 Encounter for screening for other viral diseases: Secondary | ICD-10-CM

## 2019-01-13 DIAGNOSIS — Z0000 Encounter for general adult medical examination without abnormal findings: Secondary | ICD-10-CM

## 2019-01-13 DIAGNOSIS — Z13818 Encounter for screening for other digestive system disorders: Secondary | ICD-10-CM

## 2019-01-13 NOTE — Progress Notes (Signed)
Telephone Note  I connected with Kathleen Dennis   on 01/13/19 at  9:30 AM EDT by a video enabled telemedicine application and verified that I am speaking with the correct person using two identifiers.  Location patient: home Location provider:work Persons participating in the virtual visit: patient, provider  I discussed the limitations of evaluation and management by telemedicine and the availability of in person appointments. The patient expressed understanding and agreed to proceed.   HPI: 1. Annual doing well  2. HTN BP 118/80 on losartan 100 mg qd and hctz 25 mg qd checking at home at times 120/90 she is doing increased salt intake  3. Abnormal thyroid function say [facility] 03/2018 and no f/u since she stopped biotin and declined meds and no f/u sch  4. Elevated lfts, iron overload and hemachromatosis carrier and need colonoscopy appt GI Dr. Allen Norris 01/19/2019 informed pt      ROS: See pertinent positives and negatives per HPI. General: wt stable  HEENT: no sore throat  CV: no chest pain  Lungs: no sob  GI: no ab pain  GU: no issues  MSK; no joint pain  Neuro: no h/a  Psych: no anxiety/depression Skin no skin issues   Past Medical History:  Diagnosis Date  . ADHD (attention deficit hyperactivity disorder) 07/16/2016  . Anxiety   . Elevated cholesterol with high triglycerides 07/17/2017  . GERD (gastroesophageal reflux disease) 07/16/2016  . Hot flashes   . Hypertension    since 2004 on meds since 2010    Past Surgical History:  Procedure Laterality Date  . AUGMENTATION MAMMAPLASTY Bilateral 1997  . BREAST ENHANCEMENT SURGERY      Family History  Problem Relation Age of Onset  . Thyroid disease Mother        hyperthyroidism  . Diabetes Father   . Hypertension Father   . Hyperlipidemia Father   . Heart disease Maternal Grandmother   . Depression Maternal Grandmother   . Heart disease Maternal Grandfather     SOCIAL HX:   Married  Surveyor, minerals to [employer]  No guns, wears selt belt, feels safe in relationship   Current Outpatient Medications:  .  ALPRAZolam (XANAX) 0.25 MG tablet, Take 1 tablet (0.25 mg total) by mouth 3 (three) times daily as needed for anxiety., Disp: 30 tablet, Rfl: 2 .  cetirizine (ZYRTEC) 10 MG tablet, Take by mouth., Disp: , Rfl:  .  estradiol (ESTRACE) 1 MG tablet, Take 1 tablet (1 mg total) by mouth daily., Disp: 30 tablet, Rfl: 6 .  ezetimibe (ZETIA) 10 MG tablet, Take 1 tablet (10 mg total) by mouth daily., Disp: 90 tablet, Rfl: 3 .  hydrochlorothiazide (HYDRODIURIL) 25 MG tablet, Take 1 tablet (25 mg total) by mouth daily., Disp: 90 tablet, Rfl: 3 .  lansoprazole (PREVACID) 30 MG capsule, TAKE 1 CAPSULE EVERY DAY, Disp: 30 capsule, Rfl: 11 .  losartan (COZAAR) 100 MG tablet, Take 1 tablet (100 mg total) by mouth daily., Disp: 90 tablet, Rfl: 3 .  Multiple Vitamin (MULTI VITAMIN DAILY PO), Take by mouth., Disp: , Rfl:  .  PARoxetine (PAXIL) 20 MG tablet, TAKE ONE TABLET BY MOUTH EVERY DAY, Disp: 30 tablet, Rfl: 6 .  progesterone (PROMETRIUM) 100 MG capsule, Take 1 capsule (100 mg total) by mouth daily., Disp: 30 capsule, Rfl: 6  EXAM:  VITALS per patient if applicable:  GENERAL: alert, oriented, appears well and in no acute distress  PSYCH/NEURO: pleasant and cooperative, no obvious depression or anxiety,  speech and thought processing grossly intact  ASSESSMENT AND PLAN:  Discussed the following assessment and plan:  Annual physical exam - Plan:  Had flu 2018 declines for now  Tdap utd mmr immune  Smokes sometimes rec cessation  mammo 05/19/18 normal order in to call and schedule  Pap had ASCUS neg HPV 08/24/18 pap again 08/27/18 3 pm Dr. Marcelline Mates  Referred Dr. Rob Bunting screening colonoscopy 01/19/2019  Dermatology Dr. Kellie Moor 06/2019   Cardiology Dr. Clayborn Bigness [facility]  Dentist Dr. Barnie Alderman  Dermatology [facility] Dr. Kellie Moor had biopsies of milia and nevi normal follows yearly rec  healthy diet and exercise   Elevated liver enzymes/iron overload, hemachromatosis carrier/need for colonoscopy- Plan: Hepatitis B surface antibody,quantitative, Hepatitis C antibody, Hepatitis B surface antigen, Hepatitis A Ab, Total, Hepatitis B Core Antibody, total, Anti-smooth muscle antibody, IgG, Antinuclear Antib (ANA), AntiMicrosomal Ab-Liver / Kidney appt Dr. Allen Norris 01/19/2019   Essential hypertension - Plan: Comprehensive metabolic panel, Lipid panel, CBC with Differential/Platelet Cont meds for now   Iron overload - Plan: CBC with Differential/Platelet, IBC + Ferritin  Abnormal thyroid blood test - Plan: TSH, T4, free, T3, free  Hematuria, unspecified type - Plan: Urinalysis, Routine w reflex microscopic, Urine Culture  Vitamin D deficiency - Plan: Vitamin D (25 hydroxy)       I discussed the assessment and treatment plan with the patient. The patient was provided an opportunity to ask questions and all were answered. The patient agreed with the plan and demonstrated an understanding of the instructions.   The patient was advised to call back or seek an in-person evaluation if the symptoms worsen or if the condition fails to improve as anticipated.  Time spent 20 minutes  Delorise Jackson, MD

## 2019-01-19 ENCOUNTER — Ambulatory Visit: Payer: BLUE CROSS/BLUE SHIELD | Admitting: Gastroenterology

## 2019-02-09 ENCOUNTER — Ambulatory Visit (INDEPENDENT_AMBULATORY_CARE_PROVIDER_SITE_OTHER): Payer: BC Managed Care – PPO | Admitting: Gastroenterology

## 2019-02-09 ENCOUNTER — Encounter: Payer: Self-pay | Admitting: Gastroenterology

## 2019-02-09 ENCOUNTER — Other Ambulatory Visit: Payer: Self-pay

## 2019-02-09 VITALS — Ht 65.0 in | Wt 159.0 lb

## 2019-02-09 DIAGNOSIS — R748 Abnormal levels of other serum enzymes: Secondary | ICD-10-CM

## 2019-02-09 NOTE — Progress Notes (Signed)
Kathleen Miniumarren Rhys Anchondo, MD [address]  [address]  [place], [place] [address]  Main: [phone]  Fax: [phone]    Gastroenterology Virtual/Video Visit  Referring Provider:     McLean-Scocuzza, French Anaracy * Primary Care Physician:  McLean-Scocuzza, Pasty Spillersracy N, MD Primary Gastroenterologist:  Dr.Joylyn Duggin Servando SnareWohl Reason for Consultation:     Abnormal liver enzymes        HPI:    Virtual Visit via Video Note Location of the patient: Abnormal liver enzymes Location of provider: Office  Participating persons: The patient myself and Kathleen Dennis.  I connected with Kathleen Dennis on 02/09/19 at  1:30 PM EDT by a video enabled telemedicine application and verified that I am speaking with the correct person using two identifiers.   I discussed the limitations of evaluation and management by telemedicine and the availability of in person appointments. The patient expressed understanding and agreed to proceed.  Verbal consent to proceed obtained.  History of Present Illness: Kathleen Dennis is a 53 y.o. female referred by Dr. Judie GrieveMcLean-Scocuzza, Pasty Spillersracy N, MD  for consultation & management of abnormal enzymes.  ThePatient comes in today with a history of abnormal liver enzymes.  The patient was also found to have iron studies with a high iron saturation and a ferritin over 1700.  The patient's iron was 51% saturated with upper limit of normal being 45.  The patient's hemochromatosis gene test showed her to be a carrier of the C282Y.  The patient's AST has been found to be higher than the ALT with the AST of 154 and the ALT of 92.  The pattern of the patient's labs showed:  Component     Latest Ref Rng & Units 07/16/2016 09/12/2016 07/17/2017 12/11/2017  Bilirubin, Direct     0.0 - 0.3 mg/dL      Alkaline Phosphatase     39 - 117 U/L 98 94 95 101  AST     0 - 37 U/L 88 (H) 38 44 (H) 61 (H)  ALT     0 - 35 U/L 64 (H) 35 (H) 32 47 (H)   Component     Latest Ref Rng & Units  03/05/2018  Bilirubin, Direct     0.0 - 0.3 mg/dL 0.3  Alkaline Phosphatase     39 - 117 U/L 86  AST     0 - 37 U/L 154 (H)  ALT     0 - 35 U/L 92 (H)   The patient reports that she had been seen by hematology in the past and at that time did not follow-up with any phlebotomies nor was it recommended.  The patient does report some alcohol use with approximately 1 glass of wine a day and more on the weekends but she has never been an excessive alcohol user.  She denies having diabetes but does report that her cholesterol has been high in the past.  She also has lost weight recently on a diet.  She has gone down approximately 20 pounds.    Past Medical History:  Diagnosis Date  . ADHD (attention deficit hyperactivity disorder) 07/16/2016  . Anxiety   . Elevated cholesterol with high triglycerides 07/17/2017  . GERD (gastroesophageal reflux disease) 07/16/2016  . Hot flashes   . Hypertension    since 2004 on meds since 2010    Past Surgical History:  Procedure Laterality Date  . AUGMENTATION MAMMAPLASTY Bilateral 1997  . BREAST ENHANCEMENT SURGERY      Prior to Admission  medications   Medication Sig Start Date End Date Taking? Authorizing Provider  ALPRAZolam (XANAX) 0.25 MG tablet Take 1 tablet (0.25 mg total) by mouth 3 (three) times daily as needed for anxiety. 12/12/16  Yes Shambley, Melody N, CNM  cetirizine (ZYRTEC) 10 MG tablet Take by mouth.   Yes [provider]  estradiol (ESTRACE) 1 MG tablet Take 1 tablet (1 mg total) by mouth daily. 12/15/18  Yes Rubie Maid, MD  ezetimibe (ZETIA) 10 MG tablet Take 1 tablet (10 mg total) by mouth daily. 03/10/18  Yes McLean-Scocuzza, Nino Glow, MD  hydrochlorothiazide (HYDRODIURIL) 25 MG tablet Take 1 tablet (25 mg total) by mouth daily. 12/15/18  Yes McLean-Scocuzza, Nino Glow, MD  lansoprazole (PREVACID) 30 MG capsule TAKE 1 CAPSULE EVERY DAY 09/29/18  Yes Rubie Maid, MD  losartan (COZAAR) 100 MG tablet Take 1 tablet (100 mg total)  by mouth daily. 12/15/18  Yes McLean-Scocuzza, Nino Glow, MD  Multiple Vitamin (MULTI VITAMIN DAILY PO) Take by mouth.   Yes [provider]  PARoxetine (PAXIL) 20 MG tablet TAKE ONE TABLET BY MOUTH EVERY DAY 01/05/19  Yes Shambley, Melody N, CNM  progesterone (PROMETRIUM) 100 MG capsule Take 1 capsule (100 mg total) by mouth daily. 12/15/18  Yes Rubie Maid, MD    Family History  Problem Relation Age of Onset  . Thyroid disease Mother        hyperthyroidism  . Diabetes Father   . Hypertension Father   . Hyperlipidemia Father   . Heart disease Maternal Grandmother   . Depression Maternal Grandmother   . Heart disease Maternal Grandfather      Social History   Tobacco Use  . Smoking status: Light Tobacco Smoker    Packs/day: 0.25    Years: 5.00    Pack years: 1.25    Types: Cigarettes  . Smokeless tobacco: Never Used  Substance Use Topics  . Alcohol use: Yes    Comment: socially  . Drug use: No    Allergies as of 02/09/2019 - Review Complete 02/09/2019  Allergen Reaction Noted  . Sulfa antibiotics Hives 08/29/2014    Review of Systems:    All systems reviewed and negative except where noted in HPI.   Observations/Objective:  Labs: CBC    Component Value Date/Time   WBC 6.1 03/05/2018 0802   RBC 3.91 03/05/2018 0802   HGB 13.7 03/05/2018 0802   HGB 14.0 07/17/2017 0854   HCT 39.4 03/05/2018 0802   HCT 41.3 07/17/2017 0854   PLT 134.0 (L) 03/05/2018 0802   PLT 205 07/17/2017 0854   MCV 100.7 (H) 03/05/2018 0802   MCV 102 (H) 07/17/2017 0854   MCH 34.4 (H) 07/17/2017 0854   MCHC 34.9 03/05/2018 0802   RDW 12.3 03/05/2018 0802   RDW 12.8 07/17/2017 0854   LYMPHSABS 3.0 03/05/2018 0802   MONOABS 0.5 03/05/2018 0802   EOSABS 0.1 03/05/2018 0802   BASOSABS 0.1 03/05/2018 0802   CMP     Component Value Date/Time   NA 136 12/11/2017 0805   NA 140 07/17/2017 0854   K 4.1 12/11/2017 0805   CL 97 12/11/2017 0805   CO2 30 12/11/2017 0805   GLUCOSE  112 (H) 12/11/2017 0805   BUN 11 12/11/2017 0805   BUN 17 07/17/2017 0854   CREATININE 0.86 12/11/2017 0805   CALCIUM 10.0 12/11/2017 0805   PROT 6.4 03/05/2018 0802   PROT 6.8 07/17/2017 0854   ALBUMIN 3.7 03/05/2018 0802   ALBUMIN 4.2 07/17/2017 0854  AST 154 (H) 03/05/2018 0802   ALT 92 (H) 03/05/2018 0802   ALKPHOS 86 03/05/2018 0802   BILITOT 1.0 03/05/2018 0802   BILITOT 0.4 07/17/2017 0854   GFRNONAA 109 07/17/2017 0854   GFRAA 125 07/17/2017 0854    Imaging Studies: No results found.  Assessment and Plan:   Kathleen Dennis is a 53 y.o. y/o female has been referred for abnormal liver enzymes.  The patient carries a gene for hemochromatosis.  The patient's ferritin and iron saturation are both elevated.  The patient has been told that she will need a right upper quadrant ultrasound to rule out fatty liver disease and has already had blood work ordered for other possible cause of abnormal liver enzymes which were not done.  The patient has been encouraged to go get the labs that were ordered on July 29 by her primary care provider done so that we can see if she needs vaccinations for hepatitis A or hepatitis B and to rule out other possible causes of the abnormal liver enzymes.  The patient will also be sent to hematology for phlebotomy for her hemochromatosis since her iron studies are high as well as a ferritin and she is a carrier of the gene which can in some patients because the disease.  This will also tell us if her liver enzymes do improve that the iron is the cause.  The patient will also be set up for a screening colonoscopy since she has not had a screening colonoscopy yet. I have discussed risks & benefits which include, but are not limited to, bleeding, infection, perforation & drug reaction.  The patient agrees with this plan & written consent will be obtained.     Follow Up Instructions:  I discussed the assessment and treatment plan with the patient. The  patient was provided an opportunity to ask questions and all were answered. The patient agreed with the plan and demonstrated an understanding of the instructions.   The patient was advised to call back or seek an in-person evaluation if the symptoms worsen or if the condition fails to improve as anticipated.  I provided 20 minutes of non-face-to-face time during this encounter.   Kathleen Minium, MD  Speech recognition software was used to dictate the above note.

## 2019-02-17 ENCOUNTER — Ambulatory Visit: Payer: BC Managed Care – PPO

## 2019-02-25 ENCOUNTER — Inpatient Hospital Stay: Payer: BC Managed Care – PPO | Admitting: Oncology

## 2019-03-16 ENCOUNTER — Other Ambulatory Visit: Payer: BC Managed Care – PPO

## 2019-03-31 ENCOUNTER — Other Ambulatory Visit: Payer: Self-pay

## 2019-03-31 DIAGNOSIS — Z20828 Contact with and (suspected) exposure to other viral communicable diseases: Secondary | ICD-10-CM | POA: Diagnosis not present

## 2019-03-31 DIAGNOSIS — Z20822 Contact with and (suspected) exposure to covid-19: Secondary | ICD-10-CM

## 2019-04-02 LAB — NOVEL CORONAVIRUS, NAA: SARS-CoV-2, NAA: NOT DETECTED

## 2019-04-06 ENCOUNTER — Telehealth: Payer: Self-pay | Admitting: Internal Medicine

## 2019-04-06 ENCOUNTER — Other Ambulatory Visit: Payer: Self-pay | Admitting: Internal Medicine

## 2019-04-06 DIAGNOSIS — E785 Hyperlipidemia, unspecified: Secondary | ICD-10-CM

## 2019-04-06 MED ORDER — EZETIMIBE 10 MG PO TABS: 10.0000 mg | ORAL_TABLET | Freq: Every day | ORAL | 3 refills | Status: DC

## 2019-04-06 NOTE — Telephone Encounter (Signed)
Call pt and sch fasting labs asap

## 2019-04-22 ENCOUNTER — Other Ambulatory Visit: Payer: BC Managed Care – PPO

## 2019-05-19 ENCOUNTER — Ambulatory Visit: Payer: BC Managed Care – PPO | Admitting: Internal Medicine

## 2019-07-07 DIAGNOSIS — L814 Other melanin hyperpigmentation: Secondary | ICD-10-CM | POA: Diagnosis not present

## 2019-07-07 DIAGNOSIS — L821 Other seborrheic keratosis: Secondary | ICD-10-CM | POA: Diagnosis not present

## 2019-07-07 DIAGNOSIS — X32XXXA Exposure to sunlight, initial encounter: Secondary | ICD-10-CM | POA: Diagnosis not present

## 2019-07-07 DIAGNOSIS — D23122 Other benign neoplasm of skin of left lower eyelid, including canthus: Secondary | ICD-10-CM | POA: Diagnosis not present

## 2019-07-20 ENCOUNTER — Other Ambulatory Visit: Payer: Self-pay | Admitting: Obstetrics and Gynecology

## 2019-08-25 ENCOUNTER — Other Ambulatory Visit: Payer: Self-pay | Admitting: Obstetrics and Gynecology

## 2019-08-25 DIAGNOSIS — Z1231 Encounter for screening mammogram for malignant neoplasm of breast: Secondary | ICD-10-CM

## 2019-08-27 ENCOUNTER — Other Ambulatory Visit: Payer: Self-pay

## 2019-08-27 ENCOUNTER — Ambulatory Visit (INDEPENDENT_AMBULATORY_CARE_PROVIDER_SITE_OTHER): Payer: BC Managed Care – PPO | Admitting: Obstetrics and Gynecology

## 2019-08-27 ENCOUNTER — Encounter: Payer: Self-pay | Admitting: Obstetrics and Gynecology

## 2019-08-27 VITALS — BP 131/73 | HR 88 | Ht 65.0 in | Wt 171.6 lb

## 2019-08-27 DIAGNOSIS — Z01419 Encounter for gynecological examination (general) (routine) without abnormal findings: Secondary | ICD-10-CM

## 2019-08-27 DIAGNOSIS — E663 Overweight: Secondary | ICD-10-CM | POA: Diagnosis not present

## 2019-08-27 DIAGNOSIS — I10 Essential (primary) hypertension: Secondary | ICD-10-CM

## 2019-08-27 DIAGNOSIS — N951 Menopausal and female climacteric states: Secondary | ICD-10-CM | POA: Diagnosis not present

## 2019-08-27 DIAGNOSIS — R946 Abnormal results of thyroid function studies: Secondary | ICD-10-CM

## 2019-08-27 DIAGNOSIS — E785 Hyperlipidemia, unspecified: Secondary | ICD-10-CM | POA: Diagnosis not present

## 2019-08-27 NOTE — Progress Notes (Signed)
ANNUAL PREVENTATIVE CARE GYNECOLOGY  ENCOUNTER NOTE  Subjective:       Kathleen Dennis is a 54 y.o. G1P0010 postmenopausal female here for a routine annual gynecologic exam. The patient is sexually active. The patient is taking hormone replacement therapy (initiated 2020). Patient denies post-menopausal vaginal bleeding. The patient wears seatbelts: yes. The patient participates in regular exercise: yes (3-4x weekly, strength training and HIT classes).   Current complaints: 1.  None   Gynecologic History Patient's last menstrual period was 06/17/2013 (approximate).   Contraception: post menopausal status Last Pap: 08/24/2018. Results were: abnormal - ASCUS, HR HPV neg. Denies h/o abnormal paps.  Last mammogram: ~ 05/19/2018 (performed at [facility], with implants). Results were: normal Last colonoscopy: Patient has never had one.   Obstetric History OB History  Gravida Para Term Preterm AB Living  1       1    SAB TAB Ectopic Multiple Live Births  1            # Outcome Date GA Lbr Len/2nd Weight Sex Delivery Anes PTL Lv  1 SAB 1989            Past Medical History:  Diagnosis Date  . ADHD (attention deficit hyperactivity disorder) 07/16/2016  . Anxiety   . Elevated cholesterol with high triglycerides 07/17/2017  . GERD (gastroesophageal reflux disease) 07/16/2016  . Hot flashes   . Hypertension    since 2004 on meds since 2010    Family History  Problem Relation Age of Onset  . Thyroid disease Mother        hyperthyroidism  . Diabetes Father   . Hypertension Father   . Hyperlipidemia Father   . Heart disease Maternal Grandmother   . Depression Maternal Grandmother   . Heart disease Maternal Grandfather     Past Surgical History:  Procedure Laterality Date  . AUGMENTATION MAMMAPLASTY Bilateral 1997  . BREAST ENHANCEMENT SURGERY      Social History   Socioeconomic History  . Marital status: Married    Spouse name: Not on file  . Number of  children: Not on file  . Years of education: Not on file  . Highest education level: Not on file  Occupational History  . Not on file  Tobacco Use  . Smoking status: Light Tobacco Smoker    Packs/day: 0.25    Years: 5.00    Pack years: 1.25    Types: Cigarettes  . Smokeless tobacco: Never Used  Substance and Sexual Activity  . Alcohol use: Yes    Comment: socially  . Drug use: No  . Sexual activity: Yes    Birth control/protection: Post-menopausal  Other Topics Concern  . 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    No guns, wears selt belt, feels safe in relationship    Social Determinants of Health   Financial Resource Strain:   . Difficulty of Paying Living Expenses:   Food Insecurity:   . Worried About Programme researcher, broadcasting/film/video in the Last Year:   . Barista in the Last Year:   Transportation Needs:   . Freight forwarder (Medical):   Marland Kitchen Lack of Transportation (Non-Medical):   Physical Activity:   . Days of Exercise per Week:   . Minutes of Exercise per Session:   Stress:   . Feeling of Stress :   Social Connections:   . Frequency of Communication with  Friends and Family:   . Frequency of Social Gatherings with Friends and Family:   . Attends Religious Services:   . Active Member of Clubs or Organizations:   . Attends Banker Meetings:   Marland Kitchen Marital Status:   Intimate Partner Violence:   . Fear of Current or Ex-Partner:   . Emotionally Abused:   Marland Kitchen Physically Abused:   . Sexually Abused:     Current Outpatient Medications on File Prior to Visit  Medication Sig Dispense Refill  . ALPRAZolam (XANAX) 0.25 MG tablet Take 1 tablet (0.25 mg total) by mouth 3 (three) times daily as needed for anxiety. 30 tablet 2  . cetirizine (ZYRTEC) 10 MG tablet Take by mouth.    . ezetimibe (ZETIA) 10 MG tablet Take 1 tablet (10 mg total) by mouth daily. 90 tablet 3  . hydrochlorothiazide (HYDRODIURIL) 25 MG tablet  Take 1 tablet (25 mg total) by mouth daily. 90 tablet 3  . lansoprazole (PREVACID) 30 MG capsule TAKE 1 CAPSULE BY MOUTH EVERY DAY 30 capsule 11  . losartan (COZAAR) 100 MG tablet Take 1 tablet (100 mg total) by mouth daily. 90 tablet 3  . Multiple Vitamin (MULTI VITAMIN DAILY PO) Take by mouth.    Marland Kitchen PARoxetine (PAXIL) 20 MG tablet TAKE ONE TABLET BY MOUTH EVERY DAY 30 tablet 6  . estradiol (ESTRACE) 1 MG tablet Take 1 tablet (1 mg total) by mouth daily. (Patient not taking: Reported on 08/27/2019) 30 tablet 6  . progesterone (PROMETRIUM) 100 MG capsule Take 1 capsule (100 mg total) by mouth daily. (Patient not taking: Reported on 08/27/2019) 30 capsule 6   No current facility-administered medications on file prior to visit.    Allergies  Allergen Reactions  . Sulfa Antibiotics Hives    Review of Systems ROS Review of Systems - General ROS: negative for - chills, fatigue, fever.  Positive for weight gain.  Psychological ROS: negative for - anxiety, decreased libido, depression, mood swings, physical abuse or sexual abuse Ophthalmic ROS: negative for - blurry vision, eye pain or loss of vision ENT ROS: negative for - headaches, hearing change, visual changes or vocal changes Allergy and Immunology ROS: negative for - hives, itchy/watery eyes or seasonal allergies Hematological and Lymphatic ROS: negative for - bleeding problems, bruising, swollen lymph nodes or weight loss Endocrine ROS: negative for - galactorrhea, hair pattern changes, hot flashes, malaise/lethargy, mood swings, palpitations, polydipsia/polyuria, skin changes, or unexpected weight changes.  Positive for night sweats (occasional, a few times each month).  Breast ROS: negative for - new or changing breast lumps or nipple discharge Respiratory ROS: negative for - cough or shortness of breath Cardiovascular ROS: negative for - chest pain, irregular heartbeat, palpitations or shortness of breath Gastrointestinal ROS: no  abdominal pain, change in bowel habits, or black or bloody stools Genito-Urinary ROS: no dysuria, trouble voiding, or hematuria.   Musculoskeletal ROS: negative for - joint pain or joint stiffness Neurological ROS: negative for - bowel and bladder control changes Dermatological ROS: negative for rash and skin lesion changes    Objective:   BP 131/73   Pulse 88   Ht 5\' 5"  (1.651 m)   Wt 171 lb 9.6 oz (77.8 kg)   LMP 06/17/2013 (Approximate)   BMI 28.56 kg/m    CONSTITUTIONAL: Well-developed, well-nourished female in no acute distress. Overweight.  PSYCHIATRIC: Normal mood and affect. Normal behavior. Normal judgment and thought content. NEUROLGIC: Alert and oriented to person, place, and time. Normal muscle tone coordination.  No cranial nerve deficit noted. HENT:  Normocephalic, atraumatic, External right and left ear normal. Oropharynx is clear and moist EYES: Conjunctivae and EOM are normal. Pupils are equal, round, and reactive to light. No scleral icterus.  NECK: Normal range of motion, supple, no masses.  Normal thyroid.  SKIN: Skin is warm and dry. No rash noted. Not diaphoretic. No erythema. No pallor. CARDIOVASCULAR: Normal heart rate noted, regular rhythm, no murmur. RESPIRATORY: Clear to auscultation bilaterally. Effort and breath sounds normal, no problems with respiration noted. BREASTS: Symmetric in size. No masses, skin changes, nipple drainage, or lymphadenopathy. Implants palpable.  ABDOMEN: Soft, normal bowel sounds, no distention noted.  No tenderness, rebound or guarding.  BLADDER: Normal PELVIC:  Bladder no bladder distension noted  Urethra: normal appearing urethra with no masses, tenderness or lesions  Vulva: normal appearing vulva with no masses, tenderness or lesions  Vagina: normal appearing vagina with no discharge, no lesions and mildly atrophic  Cervix: normal appearing cervix without discharge or lesions  Uterus: uterus is normal size, shape, consistency  and nontender  Adnexa: normal adnexa in size, nontender and no masses  RV: External Exam NormaI, No Rectal Masses and Normal Sphincter tone  MUSCULOSKELETAL: Normal range of motion. No tenderness.  No cyanosis, clubbing, or edema.  2+ distal pulses. LYMPHATIC: No Axillary, Supraclavicular, or Inguinal Adenopathy.    Labs:  Lab Results  Component Value Date   WBC 6.1 03/05/2018   HGB 13.7 03/05/2018   HCT 39.4 03/05/2018   MCV 100.7 (H) 03/05/2018   PLT 134.0 (L) 03/05/2018      Chemistry      Component Value Date/Time   NA 136 12/11/2017 0805   NA 140 07/17/2017 0854   K 4.1 12/11/2017 0805   CL 97 12/11/2017 0805   CO2 30 12/11/2017 0805   BUN 11 12/11/2017 0805   BUN 17 07/17/2017 0854   CREATININE 0.86 12/11/2017 0805      Component Value Date/Time   CALCIUM 10.0 12/11/2017 0805   ALKPHOS 86 03/05/2018 0802   AST 154 (H) 03/05/2018 0802   ALT 92 (H) 03/05/2018 0802   BILITOT 1.0 03/05/2018 0802   BILITOT 0.4 07/17/2017 0854      Lab Results  Component Value Date   CHOL 206 (H) 03/05/2018   HDL 29.10 (L) 03/05/2018   LDLCALC Comment 07/17/2017   LDLDIRECT 95.0 03/05/2018   TRIG (H) 03/05/2018    482.0 Triglyceride is over 400; calculations on Lipids are invalid.   CHOLHDL 7 03/05/2018    Lab Results  Component Value Date   TSH 8.39 (H) 12/11/2017    Assessment:   Annual gynecologic examination 54 y.o., postmenopausal Menopausal, on HRT Overweight  CHTN Dyslipidemia (elevated triglycerides) Abnormal thyroid labs  Plan:  - Pap: Pap Co Test performed.  - Mammogram:  Ordered  - Colon cancer screening:  Paitent did not get colonoscopy as ordered last visit. Offered all screening options including yearly stool guaiac testing, serum screening (Epi-Pro Colon), Cologuard, as well as colonoscopy. Discussed risks/benefits of each testing method. Patient with no personal or family history of colon cancer or polyps. Ordered Epi-Pro Colon at patient's request.   - Labs: CBC, CMP, Lipid 1 and TSH.  - Routine preventative health maintenance measures emphasized: Exercise/Diet/Weight control, Tobacco Warnings, Alcohol/Substance use risks and Stress Management  - Menopausal, currently on HRT. Patient still noting occasional hot flushes. Advised that she could consider increasing dose. Patient notes symptoms are not that bad, will continue at current  dose.  - CHTN, currently controlled on meds. Managed by PCP.  - Dyslipidemia currently managed by PCP.  - Abnormal thyroid testing, needs repeat labs, none since 2019.  - Patient with complaints of weight gain since last year. Review of chart notes 12 lb weight gain since August 2020. Could be secondary to thyroid abnormalities, dietary, etc.  Patient has recently started exercising. Advised on keeping a food journal.  - Declines flu vaccine.  - Return to Clinic - 1 Year for annual exam. Return in 2 weeks for review of food diary and discussion of weight management.    Hildred Laser, MD [facility]

## 2019-08-27 NOTE — Progress Notes (Signed)
Pt present for annual exam. Pt declined flu vaccine. Pt stated that she was doing well.

## 2019-09-09 ENCOUNTER — Ambulatory Visit
Admission: RE | Admit: 2019-09-09 | Discharge: 2019-09-09 | Disposition: A | Discharge status: Home / Self Care | Payer: BC Managed Care – PPO | Source: Ambulatory Visit | Attending: Obstetrics and Gynecology | Admitting: Obstetrics and Gynecology

## 2019-09-09 DIAGNOSIS — Z1231 Encounter for screening mammogram for malignant neoplasm of breast: Secondary | ICD-10-CM | POA: Insufficient documentation

## 2019-09-09 NOTE — Patient Instructions (Signed)
Preventing Unhealthy Weight Gain, Adult Staying at a healthy weight is important to your overall health. When fat builds up in your body, you may become overweight or obese. Being overweight or obese increases your risk of developing certain health problems, such as heart disease, diabetes, sleeping problems, joint problems, and some types of cancer. Unhealthy weight gain is often the result of making unhealthy food choices or not getting enough exercise. You can make changes to your lifestyle to prevent obesity and stay as healthy as possible. What nutrition changes can be made?   Eat only as much as your body needs. To do this: ? Pay attention to signs that you are hungry or full. Stop eating as soon as you feel full. ? If you feel hungry, try drinking water first before eating. Drink enough water so your urine is clear or pale yellow. ? Eat smaller portions. Pay attention to portion sizes when eating out. ? Look at serving sizes on food labels. Most foods contain more than one serving per container. ? Eat the recommended number of calories for your gender and activity level. For most active people, a daily total of 2,000 calories is appropriate. If you are trying to lose weight or are not very active, you may need to eat fewer calories. Talk with your health care provider or a diet and nutrition specialist (dietitian) about how many calories you need each day.  Choose healthy foods, such as: ? Fruits and vegetables. At each meal, try to fill at least half of your plate with fruits and vegetables. ? Whole grains, such as whole-wheat bread, brown rice, and quinoa. ? Lean meats, such as chicken or fish. ? Other healthy proteins, such as beans, eggs, or tofu. ? Healthy fats, such as nuts, seeds, fatty fish, and olive oil. ? Low-fat or fat-free dairy products.  Check food labels, and avoid food and drinks that: ? Are high in calories. ? Have added sugar. ? Are high in sodium. ? Have saturated  fats or trans fats.  Cook foods in healthier ways, such as by baking, broiling, or grilling.  Make a meal plan for the week, and shop with a grocery list to help you stay on track with your purchases. Try to avoid going to the grocery store when you are hungry.  When grocery shopping, try to shop around the outside of the store first, where the fresh foods are. Doing this helps you to avoid prepackaged foods, which can be high in sugar, salt (sodium), and fat. What lifestyle changes can be made?   Exercise for 30 or more minutes on 5 or more days each week. Exercising may include brisk walking, yard work, biking, running, swimming, and team sports like basketball and soccer. Ask your health care provider which exercises are safe for you.  Do muscle-strengthening activities, such as lifting weights or using resistance bands, on 2 or more days a week.  Do not use any products that contain nicotine or tobacco, such as cigarettes and e-cigarettes. If you need help quitting, ask your health care provider.  Limit alcohol intake to no more than 1 drink a day for nonpregnant women and 2 drinks a day for men. One drink equals 12 oz of beer, 5 oz of wine, or 1 oz of hard liquor.  Try to get 7-9 hours of sleep each night. What other changes can be made?  Keep a food and activity journal to keep track of: ? What you ate and how many calories   you had. Remember to count the calories in sauces, dressings, and side dishes. ? Whether you were active, and what exercises you did. ? Your calorie, weight, and activity goals.  Check your weight regularly. Track any changes. If you notice you have gained weight, make changes to your diet or activity routine.  Avoid taking weight-loss medicines or supplements. Talk to your health care provider before starting any new medicine or supplement.  Talk to your health care provider before trying any new diet or exercise plan. Why are these changes  important? Eating healthy, staying active, and having healthy habits can help you to prevent obesity. Those changes also:  Help you manage stress and emotions.  Help you connect with friends and family.  Improve your self-esteem.  Improve your sleep.  Prevent long-term health problems. What can happen if changes are not made? Being obese or overweight can cause you to develop joint or bone problems, which can make it hard for you to stay active or do activities you enjoy. Being obese or overweight also puts stress on your heart and lungs and can lead to health problems like diabetes, heart disease, and some cancers. Where to find more information Talk with your health care provider or a dietitian about healthy eating and healthy lifestyle choices. You may also find information from:  [place]. Department of Agriculture, MyPlate: www.choosemyplate.gov  American Heart Association: www.heart.org  Centers for Disease Control and Prevention: www.cdc.gov Summary  Staying at a healthy weight is important to your overall health. It helps you to prevent certain diseases and health problems, such as heart disease, diabetes, joint problems, sleep disorders, and some types of cancer.  Being obese or overweight can cause you to develop joint or bone problems, which can make it hard for you to stay active or do activities you enjoy.  You can prevent unhealthy weight gain by eating a healthy diet, exercising regularly, not smoking, limiting alcohol, and getting enough sleep.  Talk with your health care provider or a dietitian for guidance about healthy eating and healthy lifestyle choices. This information is not intended to replace advice given to you by your health care provider. Make sure you discuss any questions you have with your health care provider. Document Revised: 06/06/2017 Document Reviewed: 07/10/2016 Elsevier Patient Education  2020 Elsevier Inc.  

## 2019-09-09 NOTE — Progress Notes (Signed)
    GYNECOLOGY CLINIC PROGRESS NOTE Subjective:     Kathleen Dennis is a 54 y.o. female here for discussion regarding weight loss. She has noted a weight gain of approximately 12 pounds over the last 6-7 months.  History of eating disorders: none. Previous treatments for obesity include prescription appetite suppressants: Phentermine and self-directed dieting. Obesity associated medical conditions: hyperlipidemia and hypertension. Obesity associated medications: none. Cardiovascular risk factors besides obesity: dyslipidemia, hypertension and obesity (BMI >= 30 kg/m2).  She has recently discontinued her menopausal HRT medications as she is no longer symptomatic. Kathleen Dennis has brought in her food diary today.  She has recently begun exercising, working with a Psychologist, educational. Also started intermittent fasting 3-4 days ago (15:9). Notes that this is working well for her.  She drinks approximately four 32 oz bottles of water daily. Limits salt intake. Bowel movements are normal.   Additionally, she reports that she desires a refill on her Xanax.  Has a history of anxiety, usually symptoms controlled with her Paxil, but notes that the Xanax helps her to sleep when needed.   The following portions of the patient's history were reviewed and updated as appropriate: allergies, current medications, past family history, past medical history, past social history, past surgical history and problem list.  Review of Systems Pertinent items noted in HPI and remainder of comprehensive ROS otherwise negative.    Objective:   Vitals with BMI 09/10/2019 08/27/2019 02/09/2019  Height 5\' 5"  5\' 5"  5\' 5"   Weight 171 lbs 171 lbs 10 oz 159 lbs  BMI 28.46 28.56 26.46  Systolic 151 131 -  Diastolic 93 73 -  Pulse 77 88 -     General appearance: alert and no distress Heart and lung exam as previously documented during well-visit on 08/27/19.  Abdomen: soft, non-tender; bowel sounds normal; no masses,  no organomegaly     Assessment:   Overweight. I assessed Kathleen Dennis to be in an action stage with respect to weight loss. Hypertension Hyperlipidemia Abnormal thyroid testing Anxiety  Plan:    General weight loss/lifestyle modification strategies discussed (elicit support from others; identify saboteurs; non-food rewards, etc). Diet interventions: low calorie (1000 kCal/d) deficit diet and qualitative changes (increase low-fat,  high-fiber foods).  Strongly encouraged to decrease carb intake (as most of food diary with high glycemic carbohydrates during non-fasting phase).  Informal exercise measures discussed, e.g. taking stairs instead of elevator. Regular aerobic exercise program discussed.   Discussed weight loss medication use as she has used in the past, however would not recommend Phentermine due to patient's HTN (currently elevated today, however patient reports she took her medications just before her appointment).  Patient also notes she did not really like using the Phentermine but would be open to other options if needed.  Patient needs updated labs, including CMP, glucose, Lipid panel, and TSH (as last TSH was abnormal without futher workup).  Continue food diary.  Discussed anxiety and need for sleep aid. Discussed risks of continued use of Xanax as it is a controlled substance with abuse and addiction potential. Patient willing to try an alternative. Will prescribe Trazadone as she needs intermittently.  Follow up in 1 month.     A total of 15 minutes were spent face-to-face with the patient during this encounter and over half of that time dealt with counseling and coordination of care.  , MD [facility]

## 2019-09-10 ENCOUNTER — Encounter: Payer: Self-pay | Admitting: Obstetrics and Gynecology

## 2019-09-10 ENCOUNTER — Ambulatory Visit (INDEPENDENT_AMBULATORY_CARE_PROVIDER_SITE_OTHER): Payer: BC Managed Care – PPO | Admitting: Obstetrics and Gynecology

## 2019-09-10 ENCOUNTER — Other Ambulatory Visit: Payer: BC Managed Care – PPO

## 2019-09-10 ENCOUNTER — Other Ambulatory Visit: Payer: Self-pay

## 2019-09-10 VITALS — BP 151/93 | HR 77 | Ht 65.0 in | Wt 171.0 lb

## 2019-09-10 DIAGNOSIS — Z7689 Persons encountering health services in other specified circumstances: Secondary | ICD-10-CM

## 2019-09-10 DIAGNOSIS — E663 Overweight: Secondary | ICD-10-CM | POA: Diagnosis not present

## 2019-09-10 DIAGNOSIS — R946 Abnormal results of thyroid function studies: Secondary | ICD-10-CM | POA: Diagnosis not present

## 2019-09-10 DIAGNOSIS — Z01419 Encounter for gynecological examination (general) (routine) without abnormal findings: Secondary | ICD-10-CM | POA: Diagnosis not present

## 2019-09-10 MED ORDER — TRAZODONE HCL 50 MG PO TABS: 50.0000 mg | ORAL_TABLET | Freq: Every evening | ORAL | 3 refills | Status: DC | PRN

## 2019-09-11 LAB — CBC
Hematocrit: 41.5 % (ref 34.0–46.6)
Hemoglobin: 14.1 g/dL (ref 11.1–15.9)
MCH: 34.6 pg — ABNORMAL HIGH (ref 26.6–33.0)
MCHC: 34 g/dL (ref 31.5–35.7)
MCV: 102 fL — ABNORMAL HIGH (ref 79–97)
Platelets: 224 10*3/uL (ref 150–450)
RBC: 4.07 x10E6/uL (ref 3.77–5.28)
RDW: 11.2 % — ABNORMAL LOW (ref 11.7–15.4)
WBC: 7.6 10*3/uL (ref 3.4–10.8)

## 2019-09-11 LAB — LIPID PANEL
Chol/HDL Ratio: 5 ratio — ABNORMAL HIGH (ref 0.0–4.4)
Cholesterol, Total: 211 mg/dL — ABNORMAL HIGH (ref 100–199)
HDL: 42 mg/dL (ref >39)
LDL Chol Calc (NIH): 100 mg/dL — ABNORMAL HIGH (ref 0–99)
Triglycerides: 413 mg/dL — ABNORMAL HIGH (ref 0–149)
VLDL Cholesterol Cal: 69 mg/dL — ABNORMAL HIGH (ref 5–40)

## 2019-09-11 LAB — COMPREHENSIVE METABOLIC PANEL
ALT: 36 IU/L — ABNORMAL HIGH (ref 0–32)
AST: 35 IU/L (ref 0–40)
Albumin/Globulin Ratio: 2.1 (ref 1.2–2.2)
Albumin: 4.4 g/dL (ref 3.8–4.9)
Alkaline Phosphatase: 97 IU/L (ref 39–117)
BUN/Creatinine Ratio: 13 (ref 9–23)
BUN: 8 mg/dL (ref 6–24)
Bilirubin Total: 0.4 mg/dL (ref 0.0–1.2)
CO2: 24 mmol/L (ref 20–29)
Calcium: 9.8 mg/dL (ref 8.7–10.2)
Chloride: 99 mmol/L (ref 96–106)
Creatinine, Ser: 0.61 mg/dL (ref 0.57–1.00)
GFR calc Af Amer: 121 mL/min/{1.73_m2} (ref >59)
GFR calc non Af Amer: 105 mL/min/{1.73_m2} (ref >59)
Globulin, Total: 2.1 g/dL (ref 1.5–4.5)
Glucose: 138 mg/dL — ABNORMAL HIGH (ref 65–99)
Potassium: 4.2 mmol/L (ref 3.5–5.2)
Sodium: 137 mmol/L (ref 134–144)
Total Protein: 6.5 g/dL (ref 6.0–8.5)

## 2019-09-11 LAB — THYROID PANEL WITH TSH
Free Thyroxine Index: 1.1 — ABNORMAL LOW (ref 1.2–4.9)
T3 Uptake Ratio: 21 % — ABNORMAL LOW (ref 24–39)
T4, Total: 5 ug/dL (ref 4.5–12.0)
TSH: 4.46 u[IU]/mL (ref 0.450–4.500)

## 2019-09-11 LAB — HEMOGLOBIN A1C
Est. average glucose Bld gHb Est-mCnc: 123 mg/dL
Hgb A1c MFr Bld: 5.9 % — ABNORMAL HIGH (ref 4.8–5.6)

## 2019-09-13 ENCOUNTER — Telehealth: Payer: Self-pay | Admitting: Internal Medicine

## 2019-09-13 NOTE — Telephone Encounter (Signed)
Will call [facility] to see if labs can be added on.

## 2019-09-13 NOTE — Telephone Encounter (Signed)
-----   Message from Bevelyn Buckles, MD sent at 09/13/2019  8:15 AM EDT ----- Liver enzymes improved  -does she want a liver US to work up elevated liver function tests over the years?   You have prediabetes a1c 5.9 if were 5.6 no prediabetes   Cholesterol I.e triglycerides improved but total and LDL stable  -do you want to try Rx fish oil if your insurance will cover this with zetia? Are you taking zetia?    If you want [place] to check vitmain B12  -we can have you come back for labs, If so please order and schedule?  Or Nyzir Dubois try to add on to [facility] labs if possible   Vincent Ehrler see if [facility] can add on iron, ferritin, tibc, # saturation as well?  Dx code iron overload/hemochromatosis carrier-see problem list for icd 10 code   Thyroid labs overall normal not c/w slightly low #s   In the future consider a urine for blood or protein

## 2019-09-15 NOTE — Telephone Encounter (Signed)
Called [facility], Left message to return call.

## 2019-09-16 NOTE — Telephone Encounter (Signed)
Labs added they will fax [place] a verbal request verification form to sign

## 2019-09-22 LAB — IRON AND TIBC
Iron Saturation: 52 % (ref 15–55)
Iron: 158 ug/dL (ref 27–159)
Total Iron Binding Capacity: 302 ug/dL (ref 250–450)
UIBC: 144 ug/dL (ref 131–425)

## 2019-09-22 LAB — SPECIMEN STATUS REPORT

## 2019-09-22 LAB — FERRITIN: Ferritin: 394 ng/mL — ABNORMAL HIGH (ref 15–150)

## 2019-10-12 ENCOUNTER — Encounter: Payer: BC Managed Care – PPO | Admitting: Obstetrics and Gynecology

## 2019-11-11 ENCOUNTER — Ambulatory Visit (INDEPENDENT_AMBULATORY_CARE_PROVIDER_SITE_OTHER): Payer: BC Managed Care – PPO | Admitting: Obstetrics and Gynecology

## 2019-11-11 ENCOUNTER — Encounter: Payer: Self-pay | Admitting: Obstetrics and Gynecology

## 2019-11-11 ENCOUNTER — Other Ambulatory Visit: Payer: Self-pay

## 2019-11-11 VITALS — BP 148/88 | HR 91 | Ht 65.0 in | Wt 169.8 lb

## 2019-11-11 DIAGNOSIS — F419 Anxiety disorder, unspecified: Secondary | ICD-10-CM

## 2019-11-11 DIAGNOSIS — E785 Hyperlipidemia, unspecified: Secondary | ICD-10-CM | POA: Diagnosis not present

## 2019-11-11 DIAGNOSIS — Z7689 Persons encountering health services in other specified circumstances: Secondary | ICD-10-CM

## 2019-11-11 DIAGNOSIS — E663 Overweight: Secondary | ICD-10-CM

## 2019-11-11 DIAGNOSIS — N951 Menopausal and female climacteric states: Secondary | ICD-10-CM

## 2019-11-11 MED ORDER — PAROXETINE HCL 20 MG PO TABS: 20.0000 mg | ORAL_TABLET | Freq: Every day | ORAL | 6 refills | Status: DC

## 2019-11-11 MED ORDER — TOPIRAMATE 25 MG PO TABS: 25.0000 mg | ORAL_TABLET | Freq: Every day | ORAL | 3 refills | Status: DC

## 2019-11-11 NOTE — Progress Notes (Signed)
GYNECOLOGY PROGRESS NOTE  Subjective:    Patient ID: Kathleen Dennis, female    DOB: 1966/04/23, 54 y.o.   MRN: [id number]  HPI  Patient is a 54 y.o. female who presents for 2 month weight management follow up. She has initiated a regimen of changing diet and exercising.  Thomes Cake is also following up on medication change (switched from Xanax which she was using to help sleep and manage anxiety to Trazadone prn). She reports that the Trazadone is going well, no undesirable side effects.  She does request a refill on her Paxil. Lastly, patient desires to discuss issues with vaginal dryness.  Notes that she has discontinued her HRT as she no longer has vasomotor symptoms but now is noting issues with dyspareunia.     Current interventions for weight loss:  1. Diet - was keeping a food diary (forgot to bring today).  She notes she has changed her diet to breakfast including egg whites and a protein, or oatmeal. Lunch usually is a "light meal", and dinner she tries to make healthy food options but limit portions. She is limiting her carbs. Does have issues with cravings mostly mid-day). Drinks four 32 oz bottles of water per day.  2. Activity - exercising 3-4 times weekly (high intensity).  3. Reports bowel movements are normal, no issues with constipation.    The following portions of the patient's history were reviewed and updated as appropriate: allergies, current medications, past family history, past medical history, past social history, past surgical history and problem list.  Review of Systems Pertinent items noted in HPI and remainder of comprehensive ROS otherwise negative.   Objective:    Vitals with BMI 11/11/2019 09/10/2019 08/27/2019  Height 5\' 5"  5\' 5"  5\' 5"   Weight 169 lbs 13 oz 171 lbs 171 lbs 10 oz  BMI 28.26 28.46 28.56  Systolic 148 151  Diastolic 88 93 73  Pulse 91 77 88     General appearance: alert and no distress Abdomen: soft, non-tender.  Waist  circumference 38 in.  Pelvis: per my previous exam on 08/27/2019, vulva and vagina were normal appearing, no lesions, except vagina was mildly atrophic. See note for full pelvic exam.   Labs:  Lab Results  Component Value Date   CHOL 211 (H) 09/10/2019   HDL 42 09/10/2019   LDLCALC 100 (H) 09/10/2019   LDLDIRECT 95.0 03/05/2018   TRIG 413 (H) 09/10/2019   CHOLHDL 5.0 (H) 09/10/2019    Assessment:   1. Encounter for weight management   2. Dyslipidemia   3. Anxiety   4. Overweight (BMI 25.0-29.9)   5. Vaginal dryness, menopausal    Plan:   1. Weight management, Overweight - patient working on diet and exercising. Still noting issues with cravings. Has adequate water intake. Advised on continuing to limit carbs, discussed off label use of Topamax for managing cravings. Discussed use of lifting weights (3-5 lbs) on days she is not otherwise exercising). To bring diet log in next visit. Not a candidate for Phentermine due to HTN.  2. Dyslipidemia, currently on Zetia. Working on diet and exercising.  3. Anxiety and sleep disturbances. Still using Paxil for daily management, uses Trazodone for sleep as needed. Discussed if patient felt that she could wean from Paxil due to possible side effect of mild weight gain. Patient notes she has been stable on this medication and does not desire to wean at this time.  4. Vaginal dryness - discussed otion of  resuming her HRT (although not necessary) vs local hormonal treatment (estrogen cream, ring, tablet, or testosterone tablet), or non-hormonal treatment with lubricants (Uberlube, Joe's H20). Given samples of Uberlube in office today. Patient can inform MD if prescription desired.     A total of 15 minutes were spent face-to-face with the patient during this encounter and over half of that time dealt with counseling and management of care.   Rubie Maid, MD [facility]

## 2019-11-11 NOTE — Progress Notes (Signed)
Pt present for weight management and refill of paxil. Pt stated that she was doing well no problems.

## 2019-11-11 NOTE — Patient Instructions (Signed)
Topiramate tablets What is this medicine? TOPIRAMATE (toe PYRE a mate) is used to treat seizures in adults or children with epilepsy. It is also used for the prevention of migraine headaches. This medicine may be used for other purposes; ask your health care provider or pharmacist if you have questions. COMMON BRAND NAME(S): Topamax, Topiragen What should I tell my health care provider before I take this medicine? They need to know if you have any of these conditions:  bleeding disorders  kidney disease  lung or breathing disease, like asthma  suicidal thoughts, plans, or attempt; a previous suicide attempt by you or a family member  an unusual or allergic reaction to topiramate, other medicines, foods, dyes, or preservatives  pregnant or trying to get pregnant  breast-feeding How should I use this medicine? Take this medicine by mouth with a glass of water. Follow the directions on the prescription label. Do not cut, crush or chew this medicine. Swallow the tablets whole. You can take it with or without food. If it upsets your stomach, take it with food. Take your medicine at regular intervals. Do not take it more often than directed. Do not stop taking except on your doctor's advice. A special MedGuide will be given to you by the pharmacist with each prescription and refill. Be sure to read this information carefully each time. Talk to your pediatrician regarding the use of this medicine in children. While this drug may be prescribed for children as young as 2 years of age for selected conditions, precautions do apply. Overdosage: If you think you have taken too much of this medicine contact a poison control center or emergency room at once. NOTE: This medicine is only for you. Do not share this medicine with others. What if I miss a dose? If you miss a dose, take it as soon as you can. If your next dose is to be taken in less than 6 hours, then do not take the missed dose. Take the  next dose at your regular time. Do not take double or extra doses. What may interact with this medicine? This medicine may interact with the following medications:  acetazolamide  alcohol  antihistamines for allergy, cough, and cold  aspirin and aspirin-like medicines  atropine  birth control pills  certain medicines for anxiety or sleep  certain medicines for bladder problems like oxybutynin, tolterodine  certain medicines for depression like amitriptyline, fluoxetine, sertraline  certain medicines for seizures like carbamazepine, phenobarbital, phenytoin, primidone, valproic acid, zonisamide  certain medicines for stomach problems like dicyclomine, hyoscyamine  certain medicines for travel sickness like scopolamine  certain medicines for Parkinson's disease like benztropine, trihexyphenidyl  certain medicines that treat or prevent blood clots like warfarin, enoxaparin, dalteparin, apixaban, dabigatran, and rivaroxaban  digoxin  general anesthetics like halothane, isoflurane, methoxyflurane, propofol  hydrochlorothiazide  ipratropium  lithium  medicines that relax muscles for surgery  metformin  narcotic medicines for pain  NSAIDs, medicines for pain and inflammation, like ibuprofen or naproxen  phenothiazines like chlorpromazine, mesoridazine, prochlorperazine, thioridazine  pioglitazone This list may not describe all possible interactions. Give your health care provider a list of all the medicines, herbs, non-prescription drugs, or dietary supplements you use. Also tell them if you smoke, drink alcohol, or use illegal drugs. Some items may interact with your medicine. What should I watch for while using this medicine? Visit your doctor or health care professional for regular checks on your progress. Tell your health care professional if your symptoms do not   start to get better or if they get worse. Do not stop taking except on your health care professional's  advice. You may develop a severe reaction. Your health care professional will tell you how much medicine to take. Wear a medical ID bracelet or chain. Carry a card that describes your disease and details of your medicine and dosage times. This medicine can reduce the response of your body to heat or cold. Dress warm in cold weather and stay hydrated in hot weather. If possible, avoid extreme temperatures like saunas, hot tubs, very hot or cold showers, or activities that can cause dehydration such as vigorous exercise. Check with your health care professional if you have severe diarrhea, nausea, and vomiting, or if you sweat a lot. The loss of too much body fluid may make it dangerous for you to take this medicine. You may get drowsy or dizzy. Do not drive, use machinery, or do anything that needs mental alertness until you know how this medicine affects you. Do not stand up or sit up quickly, especially if you are an older patient. This reduces the risk of dizzy or fainting spells. Alcohol may interfere with the effect of this medicine. Avoid alcoholic drinks. Tell your health care professional right away if you have any change in your eyesight. Patients and their families should watch out for new or worsening depression or thoughts of suicide. Also watch out for sudden changes in feelings such as feeling anxious, agitated, panicky, irritable, hostile, aggressive, impulsive, severely restless, overly excited and hyperactive, or not being able to sleep. If this happens, especially at the beginning of treatment or after a change in dose, call your healthcare professional. This medicine may cause serious skin reactions. They can happen weeks to months after starting the medicine. Contact your health care provider right away if you notice fevers or flu-like symptoms with a rash. The rash may be red or purple and then turn into blisters or peeling of the skin. Or, you might notice a red rash with swelling of the  face, lips or lymph nodes in your neck or under your arms. Birth control may not work properly while you are taking this medicine. Talk to your health care professional about using an extra method of birth control. Women should inform their health care professional if they wish to become pregnant or think they might be pregnant. There is a potential for serious side effects and harm to an unborn child. Talk to your health care professional for more information. What side effects may I notice from receiving this medicine? Side effects that you should report to your doctor or health care professional as soon as possible:  allergic reactions like skin rash, itching or hives, swelling of the face, lips, or tongue  blood in the urine  changes in vision  confusion  loss of memory  pain in lower back or side  pain when urinating  redness, blistering, peeling or loosening of the skin, including inside the mouth  signs and symptoms of bleeding such as bloody or black, tarry stools; red or dark brown urine; spitting up blood or brown material that looks like coffee grounds; red spots on the skin; unusual bruising or bleeding from the eyes, gums, or nose  signs and symptoms of increased acid in the body like breathing fast; fast heartbeat; headache; confusion; unusually weak or tired; nausea, vomiting  suicidal thoughts, mood changes  trouble speaking or understanding  unusual sweating  unusually weak or tired Side effects   that usually do not require medical attention (report to your doctor or health care professional if they continue or are bothersome):  dizziness  drowsiness  fever  loss of appetite  nausea, vomiting  pain, tingling, numbness in the hands or feet  stomach pain  tiredness  upset stomach This list may not describe all possible side effects. Call your doctor for medical advice about side effects. You may report side effects to FDA at 1-800-FDA-1088. Where  should I keep my medicine? Keep out of the reach of children. Store at room temperature between 15 and 30 degrees C (59 and 86 degrees F). Throw away any unused medicine after the expiration date. NOTE: This sheet is a summary. It may not cover all possible information. If you have questions about this medicine, talk to your doctor, pharmacist, or health care provider.  2020 Elsevier/Gold Standard (2018-12-31 15:07:20)  

## 2019-12-14 ENCOUNTER — Encounter: Payer: Self-pay | Admitting: Obstetrics and Gynecology

## 2019-12-14 ENCOUNTER — Other Ambulatory Visit: Payer: Self-pay

## 2019-12-14 ENCOUNTER — Ambulatory Visit (INDEPENDENT_AMBULATORY_CARE_PROVIDER_SITE_OTHER): Payer: BC Managed Care – PPO | Admitting: Obstetrics and Gynecology

## 2019-12-14 VITALS — BP 132/79 | HR 80 | Ht 65.0 in | Wt 170.6 lb

## 2019-12-14 DIAGNOSIS — I10 Essential (primary) hypertension: Secondary | ICD-10-CM

## 2019-12-14 DIAGNOSIS — E785 Hyperlipidemia, unspecified: Secondary | ICD-10-CM | POA: Diagnosis not present

## 2019-12-14 DIAGNOSIS — Z7689 Persons encountering health services in other specified circumstances: Secondary | ICD-10-CM | POA: Diagnosis not present

## 2019-12-14 DIAGNOSIS — E663 Overweight: Secondary | ICD-10-CM | POA: Diagnosis not present

## 2019-12-14 DIAGNOSIS — G479 Sleep disorder, unspecified: Secondary | ICD-10-CM

## 2019-12-14 MED ORDER — TOPIRAMATE 50 MG PO TABS: 50.0000 mg | ORAL_TABLET | Freq: Two times a day (BID) | ORAL | 3 refills | Status: DC

## 2019-12-14 NOTE — Progress Notes (Signed)
Pt present for weight check. Pt stated that she was doing well no problems. Pt's last visit 11/11/2019 wt 169lb.

## 2019-12-14 NOTE — Progress Notes (Signed)
° ° ° °  GYNECOLOGY PROGRESS NOTE  Subjective:    Patient ID: Kathleen Dennis, female    DOB: 02-12-1966, 54 y.o.   MRN: [id number]  HPI  Patient is a 54 y.o. female who presents for 1 month weight management follow up. She continues a regimen of diet modification and exercising. She is overall noting that she is feeling healthier, but still not noting any significant changes on the scale. Started on Topamax last visit for weight loss. Notes that the first 2 weeks she really felt it working as she had more appetite suppression, however now is noting increased hunger again. She reports that the Trazodone that was prescribed for her sleep has helped her get more "restful" sleep. Does not have to take it every night.    Current interventions for weight loss:  1. Diet -  breakfast includes egg whites and a protein, or oatmeal. Lunch usually is a "light meal", and dinner she tries to make healthy food options but limit portions. She is limiting her carbs. Does have issues with cravings mostly mid-day). Drinks four 32 oz bottles of water per day.  2. Activity - exercising 3-4 times weekly (high intensity).  3. Reports bowel movements are normal, no issues with constipation.    The following portions of the patient's history were reviewed and updated as appropriate: allergies, current medications, past family history, past medical history, past social history, past surgical history and problem list.  Review of Systems Pertinent items noted in HPI and remainder of comprehensive ROS otherwise negative.   Objective:    Vitals with BMI 12/14/2019 11/11/2019 09/10/2019  Height 5\' 5"  5\' 5"  5\' 5"   Weight 170 lbs 10 oz 169 lbs 13 oz 171 lbs  BMI 28.39 28.26 28.46  Systolic 132 148  Diastolic 79 88 93  Pulse 80 91 77     General appearance: alert and no distress Abdomen: soft, non-tender.  Waist circumference 37 in.      Labs:   No new labs  Assessment:   1. Encounter for weight  management   2. Dyslipidemia   3. Overweight (BMI 25.0-29.9)   4. Chronic hypertension   5. Sleep disturbances    Plan:   1. Weight management, Overweight - patient working on diet and exercising. Still noting issues with cravings. Topamax was working initially but notes appetite has increased again.  Will increase to 50 mg dosing from 25 mg. Was previously not a candidate for Phentermine due to HTN, however BPs seem to be improving with diet and exercising.  Can consider if no further weight loss over next several months. Also continued to encourage patient, as overall physique today appears with more tone and muscle mass. Discussed that muscle tends to weigh more than fat, and this can affect changes seen on the scale. She has lost an inch of her waistline, and this is a .  2. Dyslipidemia, currently on Zetia. Working on diet and exercising. Will repeat levels at next visit in 2 months.  3. Anxiety and sleep disturbances. Still using Paxil for daily management, uses Trazodone for sleep as needed.  4. Chronic HTN - improving with diet and exercise.  5. RTC in 2 months.    030, MD [facility]

## 2020-01-13 ENCOUNTER — Telehealth: Payer: Self-pay | Admitting: Internal Medicine

## 2020-01-13 ENCOUNTER — Other Ambulatory Visit: Payer: Self-pay | Admitting: Internal Medicine

## 2020-01-13 DIAGNOSIS — I10 Essential (primary) hypertension: Secondary | ICD-10-CM

## 2020-01-13 MED ORDER — HYDROCHLOROTHIAZIDE 25 MG PO TABS: 25.0000 mg | ORAL_TABLET | Freq: Every day | ORAL | 0 refills | Status: DC

## 2020-01-13 MED ORDER — LOSARTAN POTASSIUM 100 MG PO TABS: 100.0000 mg | ORAL_TABLET | Freq: Every day | ORAL | 0 refills | Status: DC

## 2020-01-13 NOTE — Telephone Encounter (Signed)
Please call and sch f/u appt in person has been 1 year and for refills going forward Thank you

## 2020-01-17 NOTE — Telephone Encounter (Signed)
Called patient to set up follow up and medication refills. Patient stated she did not want to make an appointment at this time with Dr. Shirlee Latch.

## 2020-02-07 NOTE — Progress Notes (Signed)
Pt is present for weight management check. Pt stated that she was doing well no problems. Pt's last visit 12/14/2019 wt 170lb.

## 2020-02-08 ENCOUNTER — Other Ambulatory Visit: Payer: Self-pay

## 2020-02-08 ENCOUNTER — Encounter: Payer: Self-pay | Admitting: Obstetrics and Gynecology

## 2020-02-08 ENCOUNTER — Ambulatory Visit: Payer: BC Managed Care – PPO | Admitting: Obstetrics and Gynecology

## 2020-02-08 VITALS — BP 128/78 | HR 76 | Ht 65.0 in | Wt 170.0 lb

## 2020-02-08 DIAGNOSIS — Z7689 Persons encountering health services in other specified circumstances: Secondary | ICD-10-CM

## 2020-02-08 DIAGNOSIS — R7303 Prediabetes: Secondary | ICD-10-CM | POA: Insufficient documentation

## 2020-02-08 DIAGNOSIS — R946 Abnormal results of thyroid function studies: Secondary | ICD-10-CM

## 2020-02-08 DIAGNOSIS — E785 Hyperlipidemia, unspecified: Secondary | ICD-10-CM

## 2020-02-08 DIAGNOSIS — E663 Overweight: Secondary | ICD-10-CM

## 2020-02-08 MED ORDER — PHENTERMINE HCL 15 MG PO CAPS: 15.0000 mg | ORAL_CAPSULE | ORAL | 0 refills | Status: DC

## 2020-02-08 NOTE — Patient Instructions (Signed)
Phentermine tablets or capsules What is this medicine? PHENTERMINE (FEN ter meen) decreases your appetite. It is used with a reduced calorie diet and exercise to help you lose weight. This medicine may be used for other purposes; ask your health care provider or pharmacist if you have questions. COMMON BRAND NAME(S): Adipex-P, Atti-Plex P, Atti-Plex P Spansule, Fastin, Lomaira, Pro-Fast, Tara-8 What should I tell my health care provider before I take this medicine? They need to know if you have any of these conditions:  agitation or nervousness  diabetes  glaucoma  heart disease  high blood pressure  history of drug abuse or addiction  history of stroke  kidney disease  lung disease called Primary Pulmonary Hypertension (PPH)  taken an MAOI like Carbex, Eldepryl, Marplan, Nardil, or Parnate in last 14 days  taking stimulant medicines for attention disorders, weight loss, or to stay awake  thyroid disease  an unusual or allergic reaction to phentermine, other medicines, foods, dyes, or preservatives  pregnant or trying to get pregnant  breast-feeding How should I use this medicine? Take this medicine by mouth with a glass of water. Follow the directions on the prescription label. Take your medicine at regular intervals. Do not take it more often than directed. Do not stop taking except on your doctor's advice. Talk to your pediatrician regarding the use of this medicine in children. While this drug may be prescribed for children 17 years or older for selected conditions, precautions do apply. Overdosage: If you think you have taken too much of this medicine contact a poison control center or emergency room at once. NOTE: This medicine is only for you. Do not share this medicine with others. What if I miss a dose? If you miss a dose, take it as soon as you can. If it is almost time for your next dose, take only that dose. Do not take double or extra doses. What may interact  with this medicine? Do not take this medicine with any of the following medications:  MAOIs like Carbex, Eldepryl, Marplan, Nardil, and Parnate This medicine may also interact with the following medications:  alcohol  certain medicines for depression, anxiety, or psychotic disorders  certain medicines for high blood pressure  linezolid  medicines for colds or breathing difficulties like pseudoephedrine or phenylephrine  medicines for diabetes  sibutramine  stimulant medicines for attention disorders, weight loss, or to stay awake This list may not describe all possible interactions. Give your health care provider a list of all the medicines, herbs, non-prescription drugs, or dietary supplements you use. Also tell them if you smoke, drink alcohol, or use illegal drugs. Some items may interact with your medicine. What should I watch for while using this medicine? Visit your doctor or health care provider for regular checks on your progress. Do not stop taking except on your health care provider's advice. You may develop a severe reaction. Your health care provider will tell you how much medicine to take. Do not take this medicine close to bedtime. It may prevent you from sleeping. You may get drowsy or dizzy. Do not drive, use machinery, or do anything that needs mental alertness until you know how this medicine affects you. Do not stand or sit up quickly, especially if you are an older patient. This reduces the risk of dizzy or fainting spells. Alcohol may increase dizziness and drowsiness. Avoid alcoholic drinks. This medicine may affect blood sugar levels. Ask your healthcare provider if changes in diet or medicines are needed   if you have diabetes. Women should inform their health care provider if they wish to become pregnant or think they might be pregnant. Losing weight while pregnant is not advised and may cause harm to the unborn child. Talk to your health care provider for more  information. What side effects may I notice from receiving this medicine? Side effects that you should report to your doctor or health care professional as soon as possible:  allergic reactions like skin rash, itching or hives, swelling of the face, lips, or tongue  breathing problems  changes in emotions or moods  changes in vision  chest pain or chest tightness  fast, irregular heartbeat  feeling faint or lightheaded  increased blood pressure  irritable  restlessness  tremors  seizures  signs and symptoms of a stroke like changes in vision; confusion; trouble speaking or understanding; severe headaches; sudden numbness or weakness of the face, arm or leg; trouble walking; dizziness; loss of balance or coordination  unusually weak or tired Side effects that usually do not require medical attention (report to your doctor or health care professional if they continue or are bothersome):  changes in taste  constipation or diarrhea  dizziness  dry mouth  headache  trouble sleeping  upset stomach This list may not describe all possible side effects. Call your doctor for medical advice about side effects. You may report side effects to FDA at 1-800-FDA-1088. Where should I keep my medicine? Keep out of the reach of children. This medicine can be abused. Keep your medicine in a safe place to protect it from theft. Do not share this medicine with anyone. Selling or giving away this medicine is dangerous and against the law. This medicine may cause harm and death if it is taken by other adults, children, or pets. Return medicine that has not been used to an official disposal site. Contact the DEA at 1-800-882-9539 or your city/county government to find a site. If you cannot return the medicine, mix any unused medicine with a substance like cat litter or coffee grounds. Then throw the medicine away in a sealed container like a sealed bag or coffee can with a lid. Do not use the  medicine after the expiration date. Store at room temperature between 20 and 25 degrees C (68 and 77 degrees F). Keep container tightly closed. NOTE: This sheet is a summary. It may not cover all possible information. If you have questions about this medicine, talk to your doctor, pharmacist, or health care provider.  2020 Elsevier/Gold Standard (2019-04-09 12:54:20)  

## 2020-02-08 NOTE — Progress Notes (Signed)
     GYNECOLOGY PROGRESS NOTE  Subjective:    Patient ID: Kathleen Dennis, female    DOB: 1965-12-14, 54 y.o.   MRN: [id number]  HPI  Patient is a 55 y.o. female who presents for 2 month weight management follow up. Was increased from 25 mg to 50 mg of Topamax due to noticing increase in cravings.  She continues a regimen of diet modification and exercising. Does note having 2 vacations in the last 2 months, which may have thrown off her regime a little.    Current interventions for weight loss:  1. Diet -  breakfast includes egg whites and a protein, or oatmeal. Lunch usually is a "light meal", and dinner she tries to make healthy food options but limit portions. She is limiting her carbs. Does have issues with cravings mostly mid-day). Drinks four 32 oz bottles of water per day.  2. Activity - exercising 3-4 times weekly (high intensity).  3. Reports bowel movements are normal, no issues with constipation.    Also notes that she recently completed COVID vaccination series (Moderna).    The following portions of the patient's history were reviewed and updated as appropriate: allergies, current medications, past family history, past medical history, past social history, past surgical history and problem list.  Review of Systems Pertinent items noted in HPI and remainder of comprehensive ROS otherwise negative.   Objective:    Vitals with BMI 02/08/2020 12/14/2019 11/11/2019  Height 5\' 5"  5\' 5"  5\' 5"   Weight 170 lbs 170 lbs 10 oz 169 lbs 13 oz  BMI 28.29 28.39 28.26  Systolic 128 132  Diastolic 78 79 88  Pulse 76 80 91     General appearance: alert and no distress Abdomen: soft, non-tender.  Waist circumference 37 in.      Labs:   No new labs  Assessment:   1. Encounter for weight management   2. Dyslipidemia   3. Overweight (BMI 25.0-29.9)    Plan:   1. Weight management, Overweight - patient working on diet and exercising. Feels Topamax is helping to  maintain cravings. Still with no significant weight loss, but has maintained current weight (despite noting some interruptions with dieting and exercising due to vacations).  Will continue current dosing. Was previously not a candidate for Phentermine due to HTN, however BPs seem to be improving with diet and exercising. Can reconsider today as BPs well controlled. Patient ok to initiate. Will start on low dose (15 mg).  2. Dyslipidemia, currently on Zetia. Working on diet and exercising. Will repeat levels at next visit. Patient also desires other labs to be repeated as well. Will order.  3. Pre-diabetes - patient working on diet and exercise. Will repeat labs next visit.  4. Chronic HTN - improving with diet and exercise. Will monitor BPs on Phentermine. 5.   RTC in 1 months.    , MD [facility]

## 2020-03-07 ENCOUNTER — Ambulatory Visit: Payer: BC Managed Care – PPO | Admitting: Obstetrics and Gynecology

## 2020-03-07 ENCOUNTER — Other Ambulatory Visit: Payer: Self-pay

## 2020-03-07 ENCOUNTER — Encounter: Payer: Self-pay | Admitting: Obstetrics and Gynecology

## 2020-03-07 VITALS — BP 116/72 | HR 87 | Ht 65.0 in | Wt 166.7 lb

## 2020-03-07 DIAGNOSIS — R7303 Prediabetes: Secondary | ICD-10-CM | POA: Diagnosis not present

## 2020-03-07 DIAGNOSIS — E785 Hyperlipidemia, unspecified: Secondary | ICD-10-CM

## 2020-03-07 DIAGNOSIS — E663 Overweight: Secondary | ICD-10-CM | POA: Diagnosis not present

## 2020-03-07 DIAGNOSIS — I10 Essential (primary) hypertension: Secondary | ICD-10-CM

## 2020-03-07 DIAGNOSIS — Z7689 Persons encountering health services in other specified circumstances: Secondary | ICD-10-CM

## 2020-03-07 DIAGNOSIS — R238 Other skin changes: Secondary | ICD-10-CM

## 2020-03-07 MED ORDER — PHENTERMINE HCL 15 MG PO CAPS
15.0000 mg | ORAL_CAPSULE | ORAL | 1 refills | Status: DC
Start: 2020-03-07 — End: 2020-04-19

## 2020-03-07 MED ORDER — TRIAMCINOLONE 0.1 % CREAM:EUCERIN CREAM 1:1: 1.0000 "application " | TOPICAL_CREAM | Freq: Two times a day (BID) | CUTANEOUS | 0 refills | Status: AC

## 2020-03-07 NOTE — Progress Notes (Signed)
Pt present for weight management. Pt stated that she was doing well and denies any issues with the medication phentermine. Pt's last visit 02/08/2020 wt 170 lb.

## 2020-03-07 NOTE — Progress Notes (Signed)
    GYNECOLOGY PROGRESS NOTE  Subjective:    Patient ID: Avie Echevaria, female    DOB: 1966-04-29, 54 y.o.   MRN: [id number]  HPI  Patient is a 54 y.o. female who presents for 1 month weight management follow up. She initiated use of Phentermine 1 month ago. Was previously using Topamax for weight loss but had reached a plateau.  Denies any undesirable side effects and reports compliance with medications.    Current interventions:  Diet -  Monitoring portion size, consuming less carbs. High protein diet.  She is limiting her carbs. Drinks adequate amounts of water.  2. Activity - exercising 3-4 times weekly (high intensity).  3. Has normal bowel movements, no constipation.   Separately, Daphene reports note some bilateral arm itching, but notes it has been going on before she started the meds. Has tried Aveeno. Uses Dove sensitive skin and Cetaphil. Has tried Cortisone cream which helps. Uses Zyrtec as itching worsens at night or when outside.  Uses All sensitive skin.    The following portions of the patient's history were reviewed and updated as appropriate: allergies, current medications, past family history, past medical history, past social history, past surgical history and problem list.  Review of Systems Pertinent items noted in HPI and remainder of comprehensive ROS otherwise negative.   Objective:    Vitals with BMI 03/07/2020 02/08/2020 12/14/2019  Height 5\' 5"  5\' 5"  5\' 5"   Weight 166 lbs 11 oz 170 lbs 170 lbs 10 oz  BMI 27.74 28.29 28.39  Systolic 116 128  Diastolic 72 78 79  Pulse 87 76 80    General appearance: alert and no distress  Skin: bilateral upper arms with mild irritation, some areas leathery in texture. Good skin turgor.  Abdomen: soft, non-tender.  Waist circumference 38.5 in.    Labs:  No new labs  Assessment:   1. Encounter for weight management   2. Overweight (BMI 25.0-29.9)   3. Dyslipidemia   4. Prediabetes   5. Chronic  hypertension   6. Skin irritation     Plan:   1. Patient can continue use of Phentermine as she notes no concerning side effects.  Desires to continue with low dosing. Good weight loss noted.  Also can continue Topamax for management of cravings. Refilled medication.  2. Patient was due for repeat labs (cholesterol, A1c) but is not fasting. Advised to reschedule lab appointment, or can have labs drawn next visit if she schedules a morning appointment.  3. Skin irritation, unknown cause.  Possibly air allergen, skin irritant or delayed reaction from COVID vaccination (received last month). Patient also notes that her daughter is experiencing similar symptoms. Has tried multiple OTC treatments. Will prescribe Triamcinolone cream (with Eucerin).  4. RTC in 1 month (televisit) for weight check, and 2 month f/u with MD.     , MD [facility] 03/07/2020 5:05 PM

## 2020-03-07 NOTE — Patient Instructions (Signed)
Phentermine tablets or capsules What is this medicine? PHENTERMINE (FEN ter meen) decreases your appetite. It is used with a reduced calorie diet and exercise to help you lose weight. This medicine may be used for other purposes; ask your health care provider or pharmacist if you have questions. COMMON BRAND NAME(S): Adipex-P, Atti-Plex P, Atti-Plex P Spansule, Fastin, Lomaira, Pro-Fast, Tara-8 What should I tell my health care provider before I take this medicine? They need to know if you have any of these conditions:  agitation or nervousness  diabetes  glaucoma  heart disease  high blood pressure  history of drug abuse or addiction  history of stroke  kidney disease  lung disease called Primary Pulmonary Hypertension (PPH)  taken an MAOI like Carbex, Eldepryl, Marplan, Nardil, or Parnate in last 14 days  taking stimulant medicines for attention disorders, weight loss, or to stay awake  thyroid disease  an unusual or allergic reaction to phentermine, other medicines, foods, dyes, or preservatives  pregnant or trying to get pregnant  breast-feeding How should I use this medicine? Take this medicine by mouth with a glass of water. Follow the directions on the prescription label. Take your medicine at regular intervals. Do not take it more often than directed. Do not stop taking except on your doctor's advice. Talk to your pediatrician regarding the use of this medicine in children. While this drug may be prescribed for children 17 years or older for selected conditions, precautions do apply. Overdosage: If you think you have taken too much of this medicine contact a poison control center or emergency room at once. NOTE: This medicine is only for you. Do not share this medicine with others. What if I miss a dose? If you miss a dose, take it as soon as you can. If it is almost time for your next dose, take only that dose. Do not take double or extra doses. What may interact  with this medicine? Do not take this medicine with any of the following medications:  MAOIs like Carbex, Eldepryl, Marplan, Nardil, and Parnate This medicine may also interact with the following medications:  alcohol  certain medicines for depression, anxiety, or psychotic disorders  certain medicines for high blood pressure  linezolid  medicines for colds or breathing difficulties like pseudoephedrine or phenylephrine  medicines for diabetes  sibutramine  stimulant medicines for attention disorders, weight loss, or to stay awake This list may not describe all possible interactions. Give your health care provider a list of all the medicines, herbs, non-prescription drugs, or dietary supplements you use. Also tell them if you smoke, drink alcohol, or use illegal drugs. Some items may interact with your medicine. What should I watch for while using this medicine? Visit your doctor or health care provider for regular checks on your progress. Do not stop taking except on your health care provider's advice. You may develop a severe reaction. Your health care provider will tell you how much medicine to take. Do not take this medicine close to bedtime. It may prevent you from sleeping. You may get drowsy or dizzy. Do not drive, use machinery, or do anything that needs mental alertness until you know how this medicine affects you. Do not stand or sit up quickly, especially if you are an older patient. This reduces the risk of dizzy or fainting spells. Alcohol may increase dizziness and drowsiness. Avoid alcoholic drinks. This medicine may affect blood sugar levels. Ask your healthcare provider if changes in diet or medicines are needed   if you have diabetes. Women should inform their health care provider if they wish to become pregnant or think they might be pregnant. Losing weight while pregnant is not advised and may cause harm to the unborn child. Talk to your health care provider for more  information. What side effects may I notice from receiving this medicine? Side effects that you should report to your doctor or health care professional as soon as possible:  allergic reactions like skin rash, itching or hives, swelling of the face, lips, or tongue  breathing problems  changes in emotions or moods  changes in vision  chest pain or chest tightness  fast, irregular heartbeat  feeling faint or lightheaded  increased blood pressure  irritable  restlessness  tremors  seizures  signs and symptoms of a stroke like changes in vision; confusion; trouble speaking or understanding; severe headaches; sudden numbness or weakness of the face, arm or leg; trouble walking; dizziness; loss of balance or coordination  unusually weak or tired Side effects that usually do not require medical attention (report to your doctor or health care professional if they continue or are bothersome):  changes in taste  constipation or diarrhea  dizziness  dry mouth  headache  trouble sleeping  upset stomach This list may not describe all possible side effects. Call your doctor for medical advice about side effects. You may report side effects to FDA at 1-800-FDA-1088. Where should I keep my medicine? Keep out of the reach of children. This medicine can be abused. Keep your medicine in a safe place to protect it from theft. Do not share this medicine with anyone. Selling or giving away this medicine is dangerous and against the law. This medicine may cause harm and death if it is taken by other adults, children, or pets. Return medicine that has not been used to an official disposal site. Contact the DEA at 1-800-882-9539 or your city/county government to find a site. If you cannot return the medicine, mix any unused medicine with a substance like cat litter or coffee grounds. Then throw the medicine away in a sealed container like a sealed bag or coffee can with a lid. Do not use the  medicine after the expiration date. Store at room temperature between 20 and 25 degrees C (68 and 77 degrees F). Keep container tightly closed. NOTE: This sheet is a summary. It may not cover all possible information. If you have questions about this medicine, talk to your doctor, pharmacist, or health care provider.  2020 Elsevier/Gold Standard (2019-04-09 12:54:20)  

## 2020-03-09 ENCOUNTER — Telehealth: Payer: Self-pay | Admitting: Obstetrics and Gynecology

## 2020-03-09 NOTE — Telephone Encounter (Signed)
Pt called in and stated that her prescription for her cream was not sent to the pharmacy. I placed the pt on hold and called back to [place], gave her the pts MRN and asked her if the cream was sent. She stated that she will fax over another order to the pharmacy for a refill. I got back on the line with the pt and told her that the nurse will re-fax over the prescription. The pt verbally understood

## 2020-03-13 ENCOUNTER — Other Ambulatory Visit: Payer: Self-pay | Admitting: Obstetrics and Gynecology

## 2020-03-31 ENCOUNTER — Encounter: Payer: BC Managed Care – PPO | Admitting: Obstetrics and Gynecology

## 2020-04-05 ENCOUNTER — Other Ambulatory Visit: Payer: Self-pay | Admitting: Obstetrics and Gynecology

## 2020-04-19 ENCOUNTER — Encounter: Payer: Self-pay | Admitting: Obstetrics and Gynecology

## 2020-04-19 ENCOUNTER — Ambulatory Visit (INDEPENDENT_AMBULATORY_CARE_PROVIDER_SITE_OTHER): Payer: BC Managed Care – PPO | Admitting: Obstetrics and Gynecology

## 2020-04-19 ENCOUNTER — Other Ambulatory Visit: Payer: Self-pay

## 2020-04-19 VITALS — Ht 65.0 in | Wt 161.0 lb

## 2020-04-19 DIAGNOSIS — Z7689 Persons encountering health services in other specified circumstances: Secondary | ICD-10-CM | POA: Diagnosis not present

## 2020-04-19 DIAGNOSIS — E663 Overweight: Secondary | ICD-10-CM

## 2020-04-19 DIAGNOSIS — I10 Essential (primary) hypertension: Secondary | ICD-10-CM | POA: Diagnosis not present

## 2020-04-19 DIAGNOSIS — R7303 Prediabetes: Secondary | ICD-10-CM

## 2020-04-19 DIAGNOSIS — E785 Hyperlipidemia, unspecified: Secondary | ICD-10-CM

## 2020-04-19 MED ORDER — PHENTERMINE HCL 15 MG PO CAPS
15.0000 mg | ORAL_CAPSULE | ORAL | 0 refills | Status: DC
Start: 2020-04-19 — End: 2020-06-19

## 2020-04-19 NOTE — Progress Notes (Signed)
Virtual Visit via Telephone Note  I connected with Kathleen Dennis on 04/19/20 at  4:15 PM EDT by telephone and verified that I am speaking with the correct person using two identifiers.  Location: Patient: Home Provider: Office   I discussed the limitations, risks, security and privacy concerns of performing an evaluation and management service by telephone and the availability of in person appointments. I also discussed with the patient that there may be a patient responsible charge related to this service. The patient expressed understanding and agreed to proceed.   History of Present Illness:   Patient is a 54 y.o. female who presents for 1 month weight management follow up. She initiated use of Phentermine 2 months ago, has also been on Topamax for several months.  Denies any undesirable side effects and reports compliance with medications.    Current interventions:  1. Diet - Monitoring portion size, consuming less carbs. High protein diet.  She is limiting her carbs. Drinks adequate amounts of water (drinks four 32 oz bottles per day on average).  2. Activity - Exercising 3-4 times per week.  3. Reports bowel movements are normal.       Observations/Objective: Vitals with BMI 04/19/2020 03/07/2020 02/08/2020  Height 5\' 5"  5\' 5"  5\' 5"   Weight 161 lbs 166 lbs 11 oz 170 lbs  BMI 26.79 27.74 28.29  Systolic - 116 128  Diastolic - 72 78  Pulse - 87 76    General appearance: alert and no distress Abdomen: soft, non-tender.  Waist circumference 37 in.    Assessment and Plan:  1. Encounter for weight management   2. Overweight (BMI 25.0-29.9)   3. Dyslipidemia   4. Chronic hypertension   5. Prediabetes     Follow Up Instructions:   Can continue Phentermine for an additional month.  To f/u in 1 month in office for final weight check.    I discussed the assessment and treatment plan with the patient. The patient was provided an opportunity to ask questions and  all were answered. The patient agreed with the plan and demonstrated an understanding of the instructions.   The patient was advised to call back or seek an in-person evaluation if the symptoms worsen or if the condition fails to improve as anticipated.  I provided 7 minutes of non-face-to-face time during this encounter.   , MD  [facility]

## 2020-04-19 NOTE — Progress Notes (Signed)
Televisit- pt present for weight management via televisit. Pt stated that she doing well no side effects from the medication. Last  Visit 03/07/2020 wt 166 lb.

## 2020-04-21 ENCOUNTER — Other Ambulatory Visit: Payer: Self-pay

## 2020-04-21 DIAGNOSIS — I10 Essential (primary) hypertension: Secondary | ICD-10-CM

## 2020-04-21 MED ORDER — LOSARTAN POTASSIUM 100 MG PO TABS: 100.0000 mg | ORAL_TABLET | Freq: Every day | ORAL | 0 refills | Status: DC

## 2020-04-21 MED ORDER — HYDROCHLOROTHIAZIDE 25 MG PO TABS: 25.0000 mg | ORAL_TABLET | Freq: Every day | ORAL | 0 refills | Status: DC

## 2020-05-09 ENCOUNTER — Encounter: Payer: BC Managed Care – PPO | Admitting: Obstetrics and Gynecology

## 2020-05-09 NOTE — Progress Notes (Deleted)
Pt present for wt check. Pt's last visit on 04/19/2020 wt 161 lb.

## 2020-05-12 ENCOUNTER — Other Ambulatory Visit: Payer: Self-pay | Admitting: Obstetrics and Gynecology

## 2020-05-17 ENCOUNTER — Other Ambulatory Visit: Payer: Self-pay | Admitting: Internal Medicine

## 2020-05-17 DIAGNOSIS — E785 Hyperlipidemia, unspecified: Secondary | ICD-10-CM

## 2020-05-17 MED ORDER — EZETIMIBE 10 MG PO TABS: 10.0000 mg | ORAL_TABLET | Freq: Every day | ORAL | 0 refills | Status: DC

## 2020-05-18 ENCOUNTER — Other Ambulatory Visit: Payer: Self-pay

## 2020-05-18 ENCOUNTER — Encounter: Payer: Self-pay | Admitting: Obstetrics and Gynecology

## 2020-05-18 ENCOUNTER — Ambulatory Visit: Payer: BC Managed Care – PPO | Admitting: Obstetrics and Gynecology

## 2020-05-18 VITALS — BP 160/94 | HR 75 | Ht 66.0 in | Wt 164.4 lb

## 2020-05-18 DIAGNOSIS — Z7689 Persons encountering health services in other specified circumstances: Secondary | ICD-10-CM

## 2020-05-18 DIAGNOSIS — E785 Hyperlipidemia, unspecified: Secondary | ICD-10-CM | POA: Diagnosis not present

## 2020-05-18 DIAGNOSIS — R7303 Prediabetes: Secondary | ICD-10-CM | POA: Diagnosis not present

## 2020-05-18 DIAGNOSIS — I10 Essential (primary) hypertension: Secondary | ICD-10-CM | POA: Diagnosis not present

## 2020-05-18 DIAGNOSIS — E663 Overweight: Secondary | ICD-10-CM

## 2020-05-18 NOTE — Progress Notes (Signed)
    GYNECOLOGY PROGRESS NOTE  Subjective:    Patient ID: Kathleen Dennis, female    DOB: September 23, 1965, 54 y.o.   MRN: [id number]  HPI  Patient is a 54 y.o. female who presents for 1 month weight management follow up. She initiated use of Phentermine 3 month ago. Also using Topamax for weight loss.  Denies any undesirable side effects and reports compliance with medications.    Current interventions:  Diet -  Monitoring portion size, consuming less carbs. High protein diet.  She is limiting her carbs. Drinks adequate amounts of water. Feels like she did ok over the holidays.  2. Activity - exercising 3-4 times weekly (high intensity).  3. Reports normal bowel movements except this week she has not gone in several days.    The following portions of the patient's history were reviewed and updated as appropriate: allergies, current medications, past family history, past medical history, past social history, past surgical history and problem list.  Review of Systems Pertinent items noted in HPI and remainder of comprehensive ROS otherwise negative.   Objective:    Vitals with BMI 05/18/2020 04/19/2020 03/07/2020  Height 5\' 6"  5\' 5"  5\' 5"   Weight 164 lbs 6 oz 161 lbs 166 lbs 11 oz  BMI 26.55 26.79 27.74  Systolic 160 - 116  Diastolic 94 - 72  Pulse 75 - 87   General appearance: alert and no distress.  Abdomen: soft, non-tender.  Waist circumference 37.5 in.    Labs:  No new labs  Assessment:   1. Encounter for weight management   2. Chronic hypertension   3. Prediabetes   4. Dyslipidemia   5. Overweight (BMI 25.0-29.9)     Plan:   1. Initial BP elevated today, with repeat still elevated 154/102. Patient notes she was rushing this morning. Discussed though that with her history of HTN, I could not safely prescribe the Phentermine again this month.  Also noted 3 lb weight gain since last visit but waist circumference did decrease by 1 inch.  Can continue Topamax for  weight management and can f/u in 1 month once BPs go back down as she had tolerated the first 2 months with no significant elevations. Recommend taking a stool softener and increasing fiber to help with bowel movements.  2. Patient due for repeat labs (cholesterol, A1c).  Had a cup of coffee this morning.  3. RTC in 1 month for weight check.     , MD [facility] 05/18/2020 8:59 AM

## 2020-05-18 NOTE — Progress Notes (Signed)
Pt present for weight management. Pt stated that she was doing well no problems.  

## 2020-05-19 LAB — LIPID PANEL
Chol/HDL Ratio: 4.3 ratio (ref 0.0–4.4)
Cholesterol, Total: 232 mg/dL — ABNORMAL HIGH (ref 100–199)
HDL: 54 mg/dL (ref >39)
LDL Chol Calc (NIH): 130 mg/dL — ABNORMAL HIGH (ref 0–99)
Triglycerides: 273 mg/dL — ABNORMAL HIGH (ref 0–149)
VLDL Cholesterol Cal: 48 mg/dL — ABNORMAL HIGH (ref 5–40)

## 2020-05-19 LAB — HEMOGLOBIN A1C
Est. average glucose Bld gHb Est-mCnc: 120 mg/dL
Hgb A1c MFr Bld: 5.8 % — ABNORMAL HIGH (ref 4.8–5.6)

## 2020-06-20 ENCOUNTER — Encounter: Payer: Self-pay | Admitting: Obstetrics and Gynecology

## 2020-06-20 ENCOUNTER — Ambulatory Visit: Payer: BC Managed Care – PPO | Admitting: Obstetrics and Gynecology

## 2020-06-20 ENCOUNTER — Other Ambulatory Visit: Payer: Self-pay

## 2020-06-20 VITALS — BP 144/91 | HR 88 | Ht 66.0 in | Wt 166.3 lb

## 2020-06-20 DIAGNOSIS — I10 Essential (primary) hypertension: Secondary | ICD-10-CM

## 2020-06-20 DIAGNOSIS — R7303 Prediabetes: Secondary | ICD-10-CM

## 2020-06-20 DIAGNOSIS — Z1211 Encounter for screening for malignant neoplasm of colon: Secondary | ICD-10-CM

## 2020-06-20 DIAGNOSIS — Z7689 Persons encountering health services in other specified circumstances: Secondary | ICD-10-CM

## 2020-06-20 DIAGNOSIS — K219 Gastro-esophageal reflux disease without esophagitis: Secondary | ICD-10-CM

## 2020-06-20 MED ORDER — PHENTERMINE HCL 15 MG PO CAPS
15.0000 mg | ORAL_CAPSULE | ORAL | 0 refills | Status: DC
Start: 2020-06-20 — End: 2020-08-01

## 2020-06-20 MED ORDER — METFORMIN HCL 500 MG PO TABS: 500.0000 mg | ORAL_TABLET | Freq: Two times a day (BID) | ORAL | 6 refills | Status: DC

## 2020-06-20 MED ORDER — OMEPRAZOLE 40 MG PO CPDR: 40.0000 mg | DELAYED_RELEASE_CAPSULE | Freq: Every day | ORAL | 3 refills | Status: DC

## 2020-06-20 NOTE — Progress Notes (Signed)
    GYNECOLOGY PROGRESS NOTE  Subjective:    Patient ID: Kathleen Dennis, female    DOB: [birth year], 55 y.o.   MRN: [id number]  HPI  Patient is a 55 y.o. female who presents for 1 month weight management follow up. She discontinued use of Phentermine last month due to elevations in blood pressure. Notes that she also had "fallen off" of her diet last month due to the holidays. Has gained weight and feels less energized or motivated. Had used Phentermine x 3 months and Topamax.    Current interventions:  Diet - Plans to resume diet, cutting carbohydrates, watching portions, increasing protei.  2. Activity - Has not worked out in 2 weeks due to the holidays. Previously was exercising 3-4 days per weeks.  3. Reports bowel movements less regular due to recent dietary changes.   She also reports that she needs to have her reflux medication changed as her insurance will no longer cover her Prevacid.   The following portions of the patient's history were reviewed and updated as appropriate: allergies, current medications, past family history, past medical history, past social history, past surgical history and problem list.  Review of Systems Pertinent items noted in HPI and remainder of comprehensive ROS otherwise negative.   Objective:    Vitals with BMI 06/20/2020 05/18/2020 04/19/2020  Height 5\' 6"  5\' 6"  5\' 5"   Weight 166 lbs 5 oz 164 lbs 6 oz 161 lbs  BMI 26.85 26.55 26.79  Systolic 144 160 -  Diastolic 91 94 -  Pulse 88 75 -   General appearance: alert and no distress.  Abdomen: soft, non-tender.  Waist circumference 38 3/4 in. (previously 37.5 inches)    Labs:  No new labs  Assessment:   1. Encounter for weight management   2. Colon cancer screening   3. Prediabetes   4. Chronic hypertension   5. Gastroesophageal reflux disease without esophagitis     Plan:   1. Initial BP mildly elevated today, however improved since last visit. Can continue Topamax for  weight management and can resume Phentermine (low dose) as BPs have improved since last visit. Given precautions that if BP becomes significantly elevated again, may need to consider alternative medication.  2. Patient with latest HgbA1c of 5.8 (previously 5.9 in April of last year). Revisited discussion of medication to help with improving A1c levels, in light of minimal improvement even with dieting and exercising over the past few months. Patient ok to start. Will prescribe Metformin 500 mg BID. Discussed side effects. 3. Patient desires order for Cologuard for colon cancer screening. Will place.  4. Changed from Prevacid to Prilosec due to cost and insurance limitations.  5.  RTC in 1 month for weight check (nurse visit).     A total of 15 minutes were spent face-to-face with the patient during this encounter and over half of that time dealt with counseling and coordination of care.  , MD [facility] 06/20/2020 4:22 PM

## 2020-06-20 NOTE — Progress Notes (Signed)
Pt present for weight check. Pt stated that she was doing well stopped taking the medication Phentermine last visit due to elevated bp.

## 2020-07-10 ENCOUNTER — Other Ambulatory Visit: Payer: Self-pay | Admitting: Obstetrics and Gynecology

## 2020-07-10 NOTE — Telephone Encounter (Signed)
Please advise. Thanks Montrell Cessna 

## 2020-07-12 ENCOUNTER — Other Ambulatory Visit: Payer: Self-pay

## 2020-07-12 DIAGNOSIS — I10 Essential (primary) hypertension: Secondary | ICD-10-CM

## 2020-07-12 MED ORDER — LOSARTAN POTASSIUM 100 MG PO TABS: 100.0000 mg | ORAL_TABLET | Freq: Every day | ORAL | 0 refills | Status: DC

## 2020-07-12 MED ORDER — HYDROCHLOROTHIAZIDE 25 MG PO TABS: 25.0000 mg | ORAL_TABLET | Freq: Every day | ORAL | 0 refills | Status: DC

## 2020-07-13 DIAGNOSIS — D2262 Melanocytic nevi of left upper limb, including shoulder: Secondary | ICD-10-CM | POA: Diagnosis not present

## 2020-07-13 DIAGNOSIS — D2261 Melanocytic nevi of right upper limb, including shoulder: Secondary | ICD-10-CM | POA: Diagnosis not present

## 2020-07-13 DIAGNOSIS — D225 Melanocytic nevi of trunk: Secondary | ICD-10-CM | POA: Diagnosis not present

## 2020-07-13 DIAGNOSIS — D2272 Melanocytic nevi of left lower limb, including hip: Secondary | ICD-10-CM | POA: Diagnosis not present

## 2020-07-14 DIAGNOSIS — Z1211 Encounter for screening for malignant neoplasm of colon: Secondary | ICD-10-CM | POA: Diagnosis not present

## 2020-07-15 LAB — COLOGUARD: Cologuard: NEGATIVE

## 2020-07-22 LAB — EXTERNAL GENERIC LAB PROCEDURE: COLOGUARD: NEGATIVE

## 2020-07-22 LAB — COLOGUARD: COLOGUARD: NEGATIVE

## 2020-07-25 ENCOUNTER — Telehealth: Payer: Self-pay

## 2020-07-25 NOTE — Telephone Encounter (Signed)
mychart message sent to patient- change appointment from 07/27/20 to 07/26/20

## 2020-07-27 ENCOUNTER — Other Ambulatory Visit: Payer: Self-pay

## 2020-07-27 ENCOUNTER — Ambulatory Visit: Payer: BC Managed Care – PPO

## 2020-07-27 VITALS — BP 124/86 | HR 76 | Ht 65.0 in | Wt 161.0 lb

## 2020-07-27 DIAGNOSIS — Z7689 Persons encountering health services in other specified circumstances: Secondary | ICD-10-CM

## 2020-07-27 NOTE — Progress Notes (Unsigned)
Pt present for weight management. Pt stated that she was doing well on phentermine and denies any side effects or issues with the medication. Pt's last visit 06/20/2020 wt 166lb.

## 2020-08-01 ENCOUNTER — Other Ambulatory Visit: Payer: Self-pay | Admitting: Obstetrics and Gynecology

## 2020-08-01 NOTE — Telephone Encounter (Signed)
Please advise. Thanks Perkins Molina 

## 2020-08-08 ENCOUNTER — Telehealth: Payer: Self-pay | Admitting: Surgical

## 2020-08-08 NOTE — Telephone Encounter (Signed)
Patient is requesting Cologuard results.

## 2020-08-10 NOTE — Telephone Encounter (Signed)
Pt is aware of her cologuard results.

## 2020-08-21 ENCOUNTER — Encounter: Payer: Self-pay | Admitting: Obstetrics and Gynecology

## 2020-08-22 ENCOUNTER — Other Ambulatory Visit: Payer: Self-pay | Admitting: Internal Medicine

## 2020-08-22 DIAGNOSIS — E785 Hyperlipidemia, unspecified: Secondary | ICD-10-CM

## 2020-08-23 ENCOUNTER — Other Ambulatory Visit: Payer: Self-pay | Admitting: Obstetrics and Gynecology

## 2020-08-23 DIAGNOSIS — E785 Hyperlipidemia, unspecified: Secondary | ICD-10-CM

## 2020-08-24 ENCOUNTER — Other Ambulatory Visit: Payer: Self-pay

## 2020-08-24 ENCOUNTER — Ambulatory Visit: Payer: BC Managed Care – PPO | Admitting: Obstetrics and Gynecology

## 2020-08-24 ENCOUNTER — Encounter: Payer: Self-pay | Admitting: Obstetrics and Gynecology

## 2020-08-24 VITALS — BP 135/87 | HR 78 | Ht 65.0 in | Wt 161.0 lb

## 2020-08-24 DIAGNOSIS — I10 Essential (primary) hypertension: Secondary | ICD-10-CM | POA: Diagnosis not present

## 2020-08-24 DIAGNOSIS — Z7689 Persons encountering health services in other specified circumstances: Secondary | ICD-10-CM

## 2020-08-24 DIAGNOSIS — R7303 Prediabetes: Secondary | ICD-10-CM | POA: Diagnosis not present

## 2020-08-24 DIAGNOSIS — E663 Overweight: Secondary | ICD-10-CM

## 2020-08-24 MED ORDER — PHENTERMINE HCL 15 MG PO CAPS: ORAL_CAPSULE | ORAL | 0 refills | Status: DC

## 2020-08-24 NOTE — Progress Notes (Signed)
    GYNECOLOGY PROGRESS NOTE  Subjective:    Patient ID: Kathleen Dennis, female    DOB: Aug 03, 1965, 56 y.o.   MRN: [id number]  HPI  Patient is a 55 y.o. female who presents for 1 month weight management follow up. She resumed use of Phentermine at low dosing 2 months ago (previously discontinued due to elevations in blood pressure).  Previously used Topamax and Phentermine for weight loss. Also tried to start Metformin for pre-diabetes last month, however notes side effects of joint pain and sleep disturbances.    Current interventions:  Diet - Is cutting carbohydrates, watching portions, increasing protein . Monitoring portions.  2. Activity - Usually works out between 2-4 days per week.  3. Reports bowel movements have improved, more regular.     The following portions of the patient's history were reviewed and updated as appropriate: allergies, current medications, past family history, past medical history, past social history, past surgical history and problem list.  Review of Systems Pertinent items noted in HPI and remainder of comprehensive ROS otherwise negative.   Objective:    Vitals with BMI 08/24/2020 07/27/2020 06/20/2020  Height 5\' 5"  5\' 5"  5\' 6"   Weight 161 lbs 161 lbs 166 lbs 5 oz  BMI 26.79 26.79 26.85  Systolic 135 124  Diastolic 87 86 91  Pulse 78 76 88   General appearance: alert and no distress.  Abdomen: soft, non-tender.  Waist circumference 37 inches, previously 38 3/4 in. In January.   Labs:  No new labs  Assessment:   1. Encounter for weight management   2. Prediabetes   3. Chronic hypertension   4. Overweight (BMI 25.0-29.9)    Plan:   1. Patient with no weight loss noted on scale (remains 161 lbs) however waist circumference has decreased. Will give 1 additional month, will alternate dosing, 15 mg with 30 mg doses every other day.  Will likely be the last month of use for the medication as her BMI is below 28 and due to other  health risk factors (specifically HTN). BPs wnl today. Can continue use of Topamax. Has d/c'd metformin.  2.  RTC in 1 month for weight check with nurse.     295, MD [facility] 08/24/2020 1:19 PM

## 2020-08-24 NOTE — Progress Notes (Signed)
Pt present for weight management. Pt stated that she was doing well and no issues with the medication phentermine.

## 2020-08-25 ENCOUNTER — Telehealth: Payer: Self-pay

## 2020-08-25 DIAGNOSIS — E785 Hyperlipidemia, unspecified: Secondary | ICD-10-CM

## 2020-08-25 MED ORDER — EZETIMIBE 10 MG PO TABS: 10.0000 mg | ORAL_TABLET | Freq: Every day | ORAL | 0 refills | Status: DC

## 2020-08-25 NOTE — Telephone Encounter (Signed)
Pt called no answer LM via VM that her medication Zetia had be refilled for one month until her appointment with [facility] to retain further information.

## 2020-09-01 ENCOUNTER — Encounter: Payer: BC Managed Care – PPO | Admitting: Obstetrics and Gynecology

## 2020-09-25 ENCOUNTER — Ambulatory Visit (INDEPENDENT_AMBULATORY_CARE_PROVIDER_SITE_OTHER): Payer: BC Managed Care – PPO | Admitting: Surgical

## 2020-09-25 ENCOUNTER — Other Ambulatory Visit: Payer: Self-pay

## 2020-09-25 VITALS — BP 133/85 | HR 88 | Ht 65.0 in | Wt 159.0 lb

## 2020-09-25 DIAGNOSIS — Z7689 Persons encountering health services in other specified circumstances: Secondary | ICD-10-CM | POA: Diagnosis not present

## 2020-09-25 NOTE — Progress Notes (Signed)
Patient comes in today for weight management. Patient is down 2 pounds since her last visit a month ago. Her waist is at 94 cm/ 37 inches today. Patient stated that she is tolerating the Phentermine well. She does not take the full dose all the time due to her worries about BP being elevated. Patient stated that she does 3-4 days high intensity training a week. Patient will schedule her next visit with Dr. Valentino Saxon.

## 2020-09-27 ENCOUNTER — Other Ambulatory Visit: Payer: Self-pay | Admitting: Obstetrics and Gynecology

## 2020-09-27 DIAGNOSIS — E785 Hyperlipidemia, unspecified: Secondary | ICD-10-CM

## 2020-10-12 ENCOUNTER — Other Ambulatory Visit: Payer: Self-pay | Admitting: Internal Medicine

## 2020-10-12 ENCOUNTER — Other Ambulatory Visit: Payer: Self-pay | Admitting: Obstetrics and Gynecology

## 2020-10-12 DIAGNOSIS — I10 Essential (primary) hypertension: Secondary | ICD-10-CM

## 2020-10-23 NOTE — Progress Notes (Signed)
Pt present today for annual exam and weight management. Pt stated that she was doing well. Waist 36.  PHQ-9=2. GAD-7=0.

## 2020-10-23 NOTE — Patient Instructions (Signed)
Preventive Care 84-53 Years Old, Female Preventive care refers to lifestyle choices and visits with your health care provider that can promote health and wellness. This includes:  A yearly physical exam. This is also called an annual wellness visit.  Regular dental and eye exams.  Immunizations.  Screening for certain conditions.  Healthy lifestyle choices, such as: ? Eating a healthy diet. ? Getting regular exercise. ? Not using drugs or products that contain nicotine and tobacco. ? Limiting alcohol use. What can I expect for my preventive care visit? Physical exam Your health care provider will check your:  Height and weight. These may be used to calculate your BMI (body mass index). BMI is a measurement that tells if you are at a healthy weight.  Heart rate and blood pressure.  Body temperature.  Skin for abnormal spots. Counseling Your health care provider may ask you questions about your:  Past medical problems.  Family's medical history.  Alcohol, tobacco, and drug use.  Emotional well-being.  Home life and relationship well-being.  Sexual activity.  Diet, exercise, and sleep habits.  Work and work Statistician.  Access to firearms.  Method of birth control.  Menstrual cycle.  Pregnancy history. What immunizations do I need? Vaccines are usually given at various ages, according to a schedule. Your health care provider will recommend vaccines for you based on your age, medical history, and lifestyle or other factors, such as travel or where you work.   What tests do I need? Blood tests  Lipid and cholesterol levels. These may be checked every 5 years, or more often if you are over 53 years old.  Hepatitis C test.  Hepatitis B test. Screening  Lung cancer screening. You may have this screening every year starting at age 53 if you have a 30-pack-year history of smoking and currently smoke or have quit within the past 15 years.  Colorectal cancer  screening. ? All adults should have this screening starting at age 53 and continuing until age 17. ? Your health care provider may recommend screening at age 53 if you are at increased risk. ? You will have tests every 1-10 years, depending on your results and the type of screening test.  Diabetes screening. ? This is done by checking your blood sugar (glucose) after you have not eaten for a while (fasting). ? You may have this done every 1-3 years.  Mammogram. ? This may be done every 1-2 years. ? Talk with your health care provider about when you should start having regular mammograms. This may depend on whether you have a family history of breast cancer.  BRCA-related cancer screening. This may be done if you have a family history of breast, ovarian, tubal, or peritoneal cancers.  Pelvic exam and Pap test. ? This may be done every 3 years starting at age 53. ? Starting at age 53, this may be done every 5 years if you have a Pap test in combination with an HPV test. Other tests  STD (sexually transmitted disease) testing, if you are at risk.  Bone density scan. This is done to screen for osteoporosis. You may have this scan if you are at high risk for osteoporosis. Talk with your health care provider about your test results, treatment options, and if necessary, the need for more tests. Follow these instructions at home: Eating and drinking  Eat a diet that includes fresh fruits and vegetables, whole grains, lean protein, and low-fat dairy products.  Take vitamin and mineral supplements  as recommended by your health care provider.  Do not drink alcohol if: ? Your health care provider tells you not to drink. ? You are pregnant, may be pregnant, or are planning to become pregnant.  If you drink alcohol: ? Limit how much you have to 0-1 drink a day. ? Be aware of how much alcohol is in your drink. In the [place] one drink equals one 12 oz bottle of beer (355 mL), one 5 oz glass of  wine (148 mL), or one 1 oz glass of hard liquor (44 mL).   Lifestyle  Take daily care of your teeth and gums. Brush your teeth every morning and night with fluoride toothpaste. Floss one time each day.  Stay active. Exercise for at least 30 minutes 5 or more days each week.  Do not use any products that contain nicotine or tobacco, such as cigarettes, e-cigarettes, and chewing tobacco. If you need help quitting, ask your health care provider.  Do not use drugs.  If you are sexually active, practice safe sex. Use a condom or other form of protection to prevent STIs (sexually transmitted infections).  If you do not wish to become pregnant, use a form of birth control. If you plan to become pregnant, see your health care provider for a prepregnancy visit.  If told by your health care provider, take low-dose aspirin daily starting at age 53.  Find healthy ways to cope with stress, such as: ? Meditation, yoga, or listening to music. ? Journaling. ? Talking to a trusted person. ? Spending time with friends and family. Safety  Always wear your seat belt while driving or riding in a vehicle.  Do not drive: ? If you have been drinking alcohol. Do not ride with someone who has been drinking. ? When you are tired or distracted. ? While texting.  Wear a helmet and other protective equipment during sports activities.  If you have firearms in your house, make sure you follow all gun safety procedures. What's next?  Visit your health care provider once a year for an annual wellness visit.  Ask your health care provider how often you should have your eyes and teeth checked.  Stay up to date on all vaccines. This information is not intended to replace advice given to you by your health care provider. Make sure you discuss any questions you have with your health care provider. Document Revised: 03/07/2020 Document Reviewed: 02/12/2018 Elsevier Patient Education  2021 [place] Breast self-awareness is knowing how your breasts look and feel. Doing breast self-awareness is important. It allows you to catch a breast problem early while it is still small and can be treated. All women should do breast self-awareness, including women who have had breast implants. Tell your doctor if you notice a change in your breasts. What you need:  A mirror.  A well-lit room. How to do a breast self-exam A breast self-exam is one way to learn what is normal for your breasts and to check for changes. To do a breast self-exam: Look for changes 1. Take off all the clothes above your waist. 2. Stand in front of a mirror in a room with good lighting. 3. Put your hands on your hips. 4. Push your hands down. 5. Look at your breasts and nipples in the mirror to see if one breast or nipple looks different from the other. Check to see if: ? The shape of one breast is different. ? The size of  one breast is different. ? There are wrinkles, dips, and bumps in one breast and not the other. 6. Look at each breast for changes in the skin, such as: ? Redness. ? Scaly areas. 7. Look for changes in your nipples, such as: ? Liquid around the nipples. ? Bleeding. ? Dimpling. ? Redness. ? A change in where the nipples are.   Feel for changes 1. Lie on your back on the floor. 2. Feel each breast. To do this, follow these steps: ? Pick a breast to feel. ? Put the arm closest to that breast above your head. ? Use your other arm to feel the nipple area of your breast. Feel the area with the pads of your three middle fingers by making small circles with your fingers. For the first circle, press lightly. For the second circle, press harder. For the third circle, press even harder. ? Keep making circles with your fingers at the different pressures as you move down your breast. Stop when you feel your ribs. ? Move your fingers a little toward the center of your body. ? Start making  circles with your fingers again, this time going up until you reach your collarbone. ? Keep making up-and-down circles until you reach your armpit. Remember to keep using the three pressures. ? Feel the other breast in the same way. 3. Sit or stand in the tub or shower. 4. With soapy water on your skin, feel each breast the same way you did in step 2 when you were lying on the floor.   Write down what you find Writing down what you find can help you remember what to tell your doctor. Write down:  What is normal for each breast.  Any changes you find in each breast, including: ? The kind of changes you find. ? Whether you have pain. ? Size and location of any lumps.  When you last had your menstrual period. General tips  Check your breasts every month.  If you are breastfeeding, the best time to check your breasts is after you feed your baby or after you use a breast pump.  If you get menstrual periods, the best time to check your breasts is 5-7 days after your menstrual period is over.  With time, you will become comfortable with the self-exam, and you will begin to know if there are changes in your breasts. Contact a doctor if you:  See a change in the shape or size of your breasts or nipples.  See a change in the skin of your breast or nipples, such as red or scaly skin.  Have fluid coming from your nipples that is not normal.  Find a lump or thick area that was not there before.  Have pain in your breasts.  Have any concerns about your breast health. Summary  Breast self-awareness includes looking for changes in your breasts, as well as feeling for changes within your breasts.  Breast self-awareness should be done in front of a mirror in a well-lit room.  You should check your breasts every month. If you get menstrual periods, the best time to check your breasts is 5-7 days after your menstrual period is over.  Let your doctor know of any changes you see in your  breasts, including changes in size, changes on the skin, pain or tenderness, or fluid from your nipples that is not normal. This information is not intended to replace advice given to you by your health care provider. Make sure  you discuss any questions you have with your health care provider. Document Revised: 01/20/2018 Document Reviewed: 01/20/2018 Elsevier Patient Education  [place].

## 2020-10-25 ENCOUNTER — Encounter: Payer: Self-pay | Admitting: Obstetrics and Gynecology

## 2020-10-25 ENCOUNTER — Ambulatory Visit (INDEPENDENT_AMBULATORY_CARE_PROVIDER_SITE_OTHER): Payer: BC Managed Care – PPO | Admitting: Obstetrics and Gynecology

## 2020-10-25 ENCOUNTER — Other Ambulatory Visit: Payer: Self-pay

## 2020-10-25 VITALS — BP 110/73 | HR 81 | Ht 65.0 in | Wt 155.7 lb

## 2020-10-25 DIAGNOSIS — I10 Essential (primary) hypertension: Secondary | ICD-10-CM

## 2020-10-25 DIAGNOSIS — Z01419 Encounter for gynecological examination (general) (routine) without abnormal findings: Secondary | ICD-10-CM | POA: Diagnosis not present

## 2020-10-25 DIAGNOSIS — E785 Hyperlipidemia, unspecified: Secondary | ICD-10-CM

## 2020-10-25 DIAGNOSIS — Z1231 Encounter for screening mammogram for malignant neoplasm of breast: Secondary | ICD-10-CM

## 2020-10-25 DIAGNOSIS — Z7689 Persons encountering health services in other specified circumstances: Secondary | ICD-10-CM

## 2020-10-25 DIAGNOSIS — R7303 Prediabetes: Secondary | ICD-10-CM

## 2020-10-25 DIAGNOSIS — E663 Overweight: Secondary | ICD-10-CM

## 2020-10-25 MED ORDER — LOSARTAN POTASSIUM 100 MG PO TABS: 1.0000 | ORAL_TABLET | Freq: Every day | ORAL | 3 refills | Status: DC

## 2020-10-25 MED ORDER — HYDROCHLOROTHIAZIDE 25 MG PO TABS: 1.0000 | ORAL_TABLET | Freq: Every day | ORAL | 3 refills | Status: DC

## 2020-10-25 MED ORDER — PHENTERMINE HCL 15 MG PO CAPS: ORAL_CAPSULE | ORAL | 0 refills | Status: DC

## 2020-10-25 NOTE — Progress Notes (Signed)
ANNUAL PREVENTATIVE CARE GYNECOLOGY  ENCOUNTER NOTE  Subjective:       Kathleen Dennis is a 55 y.o. G1P0010 postmenopausal female here for a routine annual gynecologic exam and weight check. The patient is sexually active. She is not currently taking hormone replacement therapy.  Patient denies post-menopausal vaginal bleeding. The patient wears seatbelts: yes. The patient participates in regular exercise: yes.  Maintaining healthy diet, monitoring portion size.   Current complaints: 1.  None   Gynecologic History Patient's last menstrual period was 06/17/2013 (approximate).   Contraception: post menopausal status Last Pap: 08/24/2018. Results were: abnormal - ASCUS, HR HPV neg. Denies h/o abnormal paps.  Last mammogram:  09/09/2019. Results were: normal Last colonoscopy: Patient has never had one. Had Cologuard 07/14/2020, was negative.    Obstetric History OB History  Gravida Para Term Preterm AB Living  1       1    SAB IAB Ectopic Multiple Live Births  1            # Outcome Date GA Lbr Len/2nd Weight Sex Delivery Anes PTL Lv  1 SAB 1989            Past Medical History:  Diagnosis Date  . ADHD (attention deficit hyperactivity disorder) 07/16/2016  . Anxiety   . Elevated cholesterol with high triglycerides 07/17/2017  . GERD (gastroesophageal reflux disease) 07/16/2016  . Hot flashes   . Hypertension    since 2004 on meds since 2010    Family History  Problem Relation Age of Onset  . Thyroid disease Mother        hyperthyroidism  . Diabetes Father   . Hypertension Father   . Hyperlipidemia Father   . Heart disease Maternal Grandmother   . Depression Maternal Grandmother   . Heart disease Maternal Grandfather     Past Surgical History:  Procedure Laterality Date  . AUGMENTATION MAMMAPLASTY Bilateral 1997  . BREAST ENHANCEMENT SURGERY      Social History   Socioeconomic History  . Marital status: Married    Spouse name: Not on file  . Number of  children: Not on file  . Years of education: Not on file  . Highest education level: Not on file  Occupational History  . Not on file  Tobacco Use  . Smoking status: Light Tobacco Smoker    Packs/day: 0.25    Years: 5.00    Pack years: 1.25    Types: Cigarettes  . Smokeless tobacco: Never Used  Vaping Use  . Vaping Use: Never used  Substance and Sexual Activity  . Alcohol use: Not Currently    Comment: socially  . Drug use: No  . Sexual activity: Yes    Birth control/protection: Post-menopausal  Other Topics Concern  . 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    No guns, wears selt belt, feels safe in relationship    Social Determinants of Corporate investment banker Strain: Not on file  Food Insecurity: Not on file  Transportation Needs: Not on file  Physical Activity: Not on file  Stress: Not on file  Social Connections: Not on file  Intimate Partner Violence: Not on file    Current Outpatient Medications on File Prior to Visit  Medication Sig Dispense Refill  . cetirizine (ZYRTEC) 10 MG tablet Take by mouth.    . ezetimibe (ZETIA) 10 MG tablet TAKE 1 TABLET BY MOUTH DAILY.**NEED APPTFOR FUTHER  REFILLS CALL THE OFFICE [phone] 90 tablet 3  . hydrochlorothiazide (HYDRODIURIL) 25 MG tablet TAKE 1 TABLET BY MOUTH DAILY. 90 tablet 0  . losartan (COZAAR) 100 MG tablet TAKE 1 TABLET BY MOUTH DAILY. 90 tablet 3  . Multiple Vitamin (MULTI VITAMIN DAILY PO) Take by mouth.    Marland Kitchen omeprazole (PRILOSEC) 40 MG capsule Take 1 capsule (40 mg total) by mouth daily. 90 capsule 3  . PARoxetine (PAXIL) 20 MG tablet TAKE ONE TABLET BY MOUTH EVERY DAY 90 tablet 3  . phentermine 15 MG capsule Take 1 capsule and alternate with 2 capsules every other day. 45 capsule 0  . topiramate (TOPAMAX) 50 MG tablet TAKE ONE (1) TABLET BY MOUTH TWO TIMES PER DAY 30 tablet 3  . traZODone (DESYREL) 50 MG tablet TAKE ONE TABLET AT BEDTIME AS NEEDED FORSLEEP 90  tablet 1  . Triamcinolone Acetonide (TRIAMCINOLONE 0.1 % CREAM : EUCERIN) CREA Apply 1 application topically 2 (two) times daily. (Patient taking differently: Apply 1 application topically 2 (two) times daily as needed.) 1 each 0   No current facility-administered medications on file prior to visit.    Allergies  Allergen Reactions  . Sulfa Antibiotics Hives    Review of Systems ROS Review of Systems - General ROS: negative for - chills, fatigue, fever.    Psychological ROS: negative for - anxiety, decreased libido, depression, mood swings, physical abuse or sexual abuse Ophthalmic ROS: negative for - blurry vision, eye pain or loss of vision ENT ROS: negative for - headaches, hearing change, visual changes or vocal changes Allergy and Immunology ROS: negative for - hives, itchy/watery eyes or seasonal allergies Hematological and Lymphatic ROS: negative for - bleeding problems, bruising, swollen lymph nodes or weight loss Endocrine ROS: negative for - galactorrhea, hair pattern changes, hot flashes, malaise/lethargy, mood swings, palpitations, polydipsia/polyuria, skin changes, or unexpected weight changes.   Breast ROS: negative for - new or changing breast lumps or nipple discharge Respiratory ROS: negative for - cough or shortness of breath Cardiovascular ROS: negative for - chest pain, irregular heartbeat, palpitations or shortness of breath Gastrointestinal ROS: no abdominal pain, change in bowel habits, or black or bloody stools Genito-Urinary ROS: no dysuria, trouble voiding, or hematuria.   Musculoskeletal ROS: negative for - joint pain or joint stiffness Neurological ROS: negative for - bowel and bladder control changes Dermatological ROS: negative for rash and skin lesion changes    Objective:   BP 110/73   Pulse 81   Ht 5\' 5"  (1.651 m)   Wt 155 lb 11.2 oz (70.6 kg)   LMP 06/17/2013 (Approximate)   BMI 25.91 kg/m    Wt Readings from Last 3 Encounters:  10/25/20 155  lb 11.2 oz (70.6 kg)  09/25/20 159 lb (72.1 kg)  08/24/20 161 lb (73 kg)   CONSTITUTIONAL: Well-developed, well-nourished female in no acute distress. Overweight.  PSYCHIATRIC: Normal mood and affect. Normal behavior. Normal judgment and thought content. NEUROLGIC: Alert and oriented to person, place, and time. Normal muscle tone coordination. No cranial nerve deficit noted. HENT:  Normocephalic, atraumatic, External right and left ear normal. Oropharynx is clear and moist EYES: Conjunctivae and EOM are normal. Pupils are equal, round, and reactive to light. No scleral icterus.  NECK: Normal range of motion, supple, no masses.  Normal thyroid.  SKIN: Skin is warm and dry. No rash noted. Not diaphoretic. No erythema. No pallor. CARDIOVASCULAR: Normal heart rate noted, regular rhythm, no murmur. RESPIRATORY: Clear to auscultation bilaterally. Effort and breath  sounds normal, no problems with respiration noted. BREASTS: Symmetric in size. No masses, skin changes, nipple drainage, or lymphadenopathy. Implants palpable.  ABDOMEN: Soft, normal bowel sounds, no distention noted.  No tenderness, rebound or guarding.  BLADDER: Normal PELVIC:  Bladder no bladder distension noted  Urethra: normal appearing urethra with no masses, tenderness or lesions  Vulva: normal appearing vulva with no masses, tenderness or lesions  Vagina: normal appearing vagina with no discharge, no lesions and mildly atrophic  Cervix: normal appearing cervix without discharge or lesions  Uterus: uterus is normal size, shape, consistency and nontender  Adnexa: normal adnexa in size, nontender and no masses  RV: External Exam NormaI, No Rectal Masses and Normal Sphincter tone  MUSCULOSKELETAL: Normal range of motion. No tenderness.  No cyanosis, clubbing, or edema.  2+ distal pulses. LYMPHATIC: No Axillary, Supraclavicular, or Inguinal Adenopathy.    Labs:  Lab Results  Component Value Date   WBC 7.6 09/10/2019   HGB  14.1 09/10/2019   HCT 41.5 09/10/2019   MCV 102 (H) 09/10/2019   PLT 224 09/10/2019      Chemistry      Component Value Date/Time   NA 137 09/10/2019 0912   K 4.2 09/10/2019 0912   CL 99 09/10/2019 0912   CO2 24 09/10/2019 0912   BUN 8 09/10/2019 0912   CREATININE 0.61 09/10/2019 0912      Component Value Date/Time   CALCIUM 9.8 09/10/2019 0912   ALKPHOS 97 09/10/2019 0912   AST 35 09/10/2019 0912   ALT 36 (H) 09/10/2019 0912   BILITOT 0.4 09/10/2019 0912      Lab Results  Component Value Date   CHOL 232 (H) 05/18/2020   HDL 54 05/18/2020   LDLCALC 130 (H) 05/18/2020   LDLDIRECT 95.0 03/05/2018   TRIG 273 (H) 05/18/2020   CHOLHDL 4.3 05/18/2020    Lab Results  Component Value Date   TSH 4.460 09/10/2019    Assessment:   Annual gynecologic examination 55 y.o., postmenopausal Menopausal, on HRT Overweight  Weight management CHTN Dyslipidemia (elevated triglycerides) Prediabetes  Plan:  - Pap: Not needed.  Up to date. Due in 1 year.  - Mammogram:  Ordered  - Colon cancer screening:  Paitent did not get colonoscopy as ordered last visit. Offered all screening options including yearly stool guaiac testing, serum screening (Epi-Pro Colon), Cologuard, as well as colonoscopy. Discussed risks/benefits of each testing method. Patient with no personal or family history of colon cancer or polyps. Ordered Epi-Pro Colon at patient's request.  - Labs: CBC, CMP, Lipid 1 and TSH.  - Routine preventative health maintenance measures emphasized: Exercise/Diet/Weight control, Tobacco Warnings, Alcohol/Substance use risks and Stress Management  - Menopausal, currently without any symptoms.  - CHTN and dyslipdemia, currently controlled on meds. Needs refill of medication as she has not seen her PCP in over 2 years.  Notes that she would like to change PCPs. Refill given.  - Weight management on Phentermine and Topamax.  Will prescribe 1 additional month as she is responding to  medication (notes dose changed to alternating doses of 15 and 30 mg every other day, but does not do 30 mg dosing often for fear of , notes goal weight is 140.   - Declines flu vaccine.  - Declines COVID vaccine.  - Return to Clinic - 1 Year for annual exam.    Hildred Laser, MD [facility]

## 2020-10-26 LAB — TSH: TSH: 1.93 u[IU]/mL (ref 0.450–4.500)

## 2020-10-26 LAB — CBC
Hematocrit: 41 % (ref 34.0–46.6)
Hemoglobin: 14.1 g/dL (ref 11.1–15.9)
MCH: 33.3 pg — ABNORMAL HIGH (ref 26.6–33.0)
MCHC: 34.4 g/dL (ref 31.5–35.7)
MCV: 97 fL (ref 79–97)
Platelets: 246 10*3/uL (ref 150–450)
RBC: 4.23 x10E6/uL (ref 3.77–5.28)
RDW: 11.5 % — ABNORMAL LOW (ref 11.7–15.4)
WBC: 6.8 10*3/uL (ref 3.4–10.8)

## 2020-10-26 LAB — COMPREHENSIVE METABOLIC PANEL
ALT: 35 IU/L — ABNORMAL HIGH (ref 0–32)
AST: 32 IU/L (ref 0–40)
Albumin/Globulin Ratio: 2 (ref 1.2–2.2)
Albumin: 4.8 g/dL (ref 3.8–4.9)
Alkaline Phosphatase: 94 IU/L (ref 44–121)
BUN/Creatinine Ratio: 15 (ref 9–23)
BUN: 12 mg/dL (ref 6–24)
Bilirubin Total: 0.3 mg/dL (ref 0.0–1.2)
CO2: 24 mmol/L (ref 20–29)
Calcium: 9.5 mg/dL (ref 8.7–10.2)
Chloride: 97 mmol/L (ref 96–106)
Creatinine, Ser: 0.78 mg/dL (ref 0.57–1.00)
Globulin, Total: 2.4 g/dL (ref 1.5–4.5)
Glucose: 79 mg/dL (ref 65–99)
Potassium: 4.8 mmol/L (ref 3.5–5.2)
Sodium: 137 mmol/L (ref 134–144)
Total Protein: 7.2 g/dL (ref 6.0–8.5)
eGFR: 91 mL/min/{1.73_m2} (ref >59)

## 2020-10-26 LAB — LIPID PANEL
Chol/HDL Ratio: 4.5 ratio — ABNORMAL HIGH (ref 0.0–4.4)
Cholesterol, Total: 221 mg/dL — ABNORMAL HIGH (ref 100–199)
HDL: 49 mg/dL (ref >39)
LDL Chol Calc (NIH): 107 mg/dL — ABNORMAL HIGH (ref 0–99)
Triglycerides: 384 mg/dL — ABNORMAL HIGH (ref 0–149)
VLDL Cholesterol Cal: 65 mg/dL — ABNORMAL HIGH (ref 5–40)

## 2020-10-26 LAB — HEMOGLOBIN A1C
Est. average glucose Bld gHb Est-mCnc: 117 mg/dL
Hgb A1c MFr Bld: 5.7 % — ABNORMAL HIGH (ref 4.8–5.6)

## 2020-11-22 ENCOUNTER — Encounter: Payer: Self-pay | Admitting: Obstetrics and Gynecology

## 2020-11-22 ENCOUNTER — Ambulatory Visit: Payer: BC Managed Care – PPO | Admitting: Obstetrics and Gynecology

## 2020-11-22 ENCOUNTER — Other Ambulatory Visit: Payer: Self-pay

## 2020-11-22 VITALS — BP 165/92 | HR 73 | Ht 65.0 in | Wt 157.7 lb

## 2020-11-22 DIAGNOSIS — E663 Overweight: Secondary | ICD-10-CM

## 2020-11-22 DIAGNOSIS — R7303 Prediabetes: Secondary | ICD-10-CM

## 2020-11-22 DIAGNOSIS — E785 Hyperlipidemia, unspecified: Secondary | ICD-10-CM

## 2020-11-22 DIAGNOSIS — I10 Essential (primary) hypertension: Secondary | ICD-10-CM | POA: Diagnosis not present

## 2020-11-22 DIAGNOSIS — Z7689 Persons encountering health services in other specified circumstances: Secondary | ICD-10-CM

## 2020-11-22 NOTE — Progress Notes (Signed)
Pt present for weight check. Pt stated that she was doing well. Pt's waist 37in. Pt last visit 10/25/2020 BMI 25.91.

## 2020-11-22 NOTE — Progress Notes (Signed)
GYNECOLOGY PROGRESS NOTE  Subjective:    Patient ID: Kathleen Dennis, female    DOB: 04-28-1966, 55 y.o.   MRN: [id number]  HPI  Patient is a 55 y.o. female who presents for 1 month weight management follow up. She resumed use of Phentermine at low dosing 5.5 months ago. Given an additional 1 month of use of Phentermine with alternating dosing due likely plateau of weight loss efforts.  Also currently utilizing Topamax.    Current interventions:  Diet - Is cutting carbohydrates, watching portions, increasing protein . Monitoring portions.  2. Activity - Notes decrease in physical activity (down to 1-2 times per week for past 3 weeks due to work schedule) but now has returned back to 2-4 days per week.  3. Reports bowel movements have improved, more regular.      The following portions of the patient's history were reviewed and updated as appropriate: allergies, current medications, past family history, past medical history, past social history, past surgical history and problem list.  Review of Systems Pertinent items noted in HPI and remainder of comprehensive ROS otherwise negative.   Objective:    Vitals with BMI 11/22/2020 10/25/2020 09/25/2020  Height 5\' 5"  5\' 5"  5\' 5"   Weight 157 lbs 11 oz 155 lbs 11 oz 159 lbs  BMI 26.24 25.91 26.46  Systolic 165 110  Diastolic 92 73 85  Pulse 73 81 88   General appearance: alert and no distress.  Abdomen: soft, non-tender.  Waist circumference 37 inches, previously   Labs:   Lab Results  Component Value Date   CHOL 221 (H) 10/25/2020   HDL 49 10/25/2020   LDLCALC 107 (H) 10/25/2020   LDLDIRECT 95.0 03/05/2018   TRIG 384 (H) 10/25/2020   CHOLHDL 4.5 (H) 10/25/2020   Lab Results  Component Value Date   HGBA1C 5.7 (H) 10/25/2020     Assessment:   1. Encounter for weight management   2. Overweight (BMI 25.0-29.9)   3. Dyslipidemia   4. Chronic hypertension   5. Prediabetes    Plan:   Discussed with  patient that since there was no dramatic change in weight with alternating higher dosage, that it was likely time to break from the Phentermine.  Also, noted that her blood pressure was elevated today (although patient notes that her BPs likely increased due to discovering weight gain on today's visit).  Repeat was 120s/80s. Advised that her greatest weight loss is noted with her exercise regimen. Patient notes she is returning to exercising at least 3 times per week. Can continue use of Topamax as desired.   Dyslipidemia - patient held taking her Zetia for a time to see if her dietary modifications and exercise would help to improve her levels, however no significant change noted with the exception of her HDL being slightly lowered and her triglycerides actually increased. Total cholesterol decreased. Would recommend resuming medication at this time.   Prediabetes - has slightly improved since last check (5.7, down from 5.8).  Would still recommend medication to reduce even further. Noted side effects with use of Metformin. Can consider other medications that could potentially help with reduction such as Ozempic (which could also help to further weight loss efforts as well).  RTC in 2 month for weight check.    A total of 15 minutes were spent face-to-face with the patient during this encounter and over half of that time dealt with counseling and coordination of care.   12/25/2020, MD [facility]

## 2020-11-23 ENCOUNTER — Encounter: Payer: Self-pay | Admitting: Obstetrics and Gynecology

## 2020-11-23 NOTE — Patient Instructions (Signed)
Preventing Unhealthy Weight Gain, Adult Staying at a healthy weight is important to your overall health. When fat builds up in your body, you may become overweight or obese. Being overweight or obese increases your risk of developing certain health problems, such as heart disease, diabetes, sleeping problems, joint problems, and some types of cancer. Unhealthy weight gain is often the result of making unhealthy food choices or not getting enough exercise. You can make changes to your lifestyle to prevent obesity and stay as healthy as possible. What nutrition changes can be made?  Eat only as much as your body needs. To do this: ? Pay attention to signs that you are hungry or full. Stop eating as soon as you feel full. ? If you feel hungry, try drinking water first before eating. Drink enough water so your urine is clear or pale yellow. ? Eat smaller portions. Pay attention to portion sizes when eating out. ? Look at serving sizes on food labels. Most foods contain more than one serving per container. ? Eat the recommended number of calories for your gender and activity level. For most active people, a daily total of 2,000 calories is appropriate. If you are trying to lose weight or are not very active, you may need to eat fewer calories. Talk with your health care provider or a diet and nutrition specialist (dietitian) about how many calories you need each day.  Choose healthy foods, such as: ? Fruits and vegetables. At each meal, try to fill at least half of your plate with fruits and vegetables. ? Whole grains, such as whole-wheat bread, brown rice, and quinoa. ? Lean meats, such as chicken or fish. ? Other healthy proteins, such as beans, eggs, or tofu. ? Healthy fats, such as nuts, seeds, fatty fish, and olive oil. ? Low-fat or fat-free dairy products.  Check food labels, and avoid food and drinks that: ? Are high in calories. ? Have added sugar. ? Are high in sodium. ? Have saturated  fats or trans fats.  Cook foods in healthier ways, such as by baking, broiling, or grilling.  Make a meal plan for the week, and shop with a grocery list to help you stay on track with your purchases. Try to avoid going to the grocery store when you are hungry.  When grocery shopping, try to shop around the outside of the store first, where the fresh foods are. Doing this helps you to avoid prepackaged foods, which can be high in sugar, salt (sodium), and fat.   What lifestyle changes can be made?  Exercise for 30 or more minutes on 5 or more days each week. Exercising may include brisk walking, yard work, biking, running, swimming, and team sports like basketball and soccer. Ask your health care provider which exercises are safe for you.  Do muscle-strengthening activities, such as lifting weights or using resistance bands, on 2 or more days a week.  Do not use any products that contain nicotine or tobacco, such as cigarettes and e-cigarettes. If you need help quitting, ask your health care provider.  Limit alcohol intake to no more than 1 drink a day for nonpregnant women and 2 drinks a day for men. One drink equals 12 oz of beer, 5 oz of wine, or 1 oz of hard liquor.  Try to get 7-9 hours of sleep each night.   What other changes can be made?  Keep a food and activity journal to keep track of: ? What you ate and how   many calories you had. Remember to count the calories in sauces, dressings, and side dishes. ? Whether you were active, and what exercises you did. ? Your calorie, weight, and activity goals.  Check your weight regularly. Track any changes. If you notice you have gained weight, make changes to your diet or activity routine.  Avoid taking weight-loss medicines or supplements. Talk to your health care provider before starting any new medicine or supplement.  Talk to your health care provider before trying any new diet or exercise plan. Why are these changes  important? Eating healthy, staying active, and having healthy habits can help you to prevent obesity. Those changes also:  Help you manage stress and emotions.  Help you connect with friends and family.  Improve your self-esteem.  Improve your sleep.  Prevent long-term health problems. What can happen if changes are not made? Being obese or overweight can cause you to develop joint or bone problems, which can make it hard for you to stay active or do activities you enjoy. Being obese or overweight also puts stress on your heart and lungs and can lead to health problems like diabetes, heart disease, and some cancers. Where to find more information Talk with your health care provider or a dietitian about healthy eating and healthy lifestyle choices. You may also find information from:  [place] Department of Agriculture, MyPlate: www.choosemyplate.gov  American Heart Association: www.heart.org  Centers for Disease Control and Prevention: www.cdc.gov Summary  Staying at a healthy weight is important to your overall health. It helps you to prevent certain diseases and health problems, such as heart disease, diabetes, joint problems, sleep disorders, and some types of cancer.  Being obese or overweight can cause you to develop joint or bone problems, which can make it hard for you to stay active or do activities you enjoy.  You can prevent unhealthy weight gain by eating a healthy diet, exercising regularly, not smoking, limiting alcohol, and getting enough sleep.  Talk with your health care provider or a dietitian for guidance about healthy eating and healthy lifestyle choices. This information is not intended to replace advice given to you by your health care provider. Make sure you discuss any questions you have with your health care provider. Document Revised: 09/30/2019 Document Reviewed: 09/30/2019 Elsevier Patient Education  2021 Elsevier Inc.  

## 2020-12-22 ENCOUNTER — Other Ambulatory Visit: Payer: Self-pay | Admitting: Obstetrics and Gynecology

## 2021-01-30 ENCOUNTER — Encounter: Payer: BC Managed Care – PPO | Admitting: Obstetrics and Gynecology

## 2021-01-31 ENCOUNTER — Encounter: Payer: BC Managed Care – PPO | Admitting: Obstetrics and Gynecology

## 2021-02-08 ENCOUNTER — Other Ambulatory Visit: Payer: Self-pay | Admitting: Obstetrics and Gynecology

## 2021-02-20 ENCOUNTER — Encounter: Payer: BC Managed Care – PPO | Admitting: Obstetrics and Gynecology

## 2021-03-02 ENCOUNTER — Encounter: Payer: BC Managed Care – PPO | Admitting: Obstetrics and Gynecology

## 2021-03-22 ENCOUNTER — Encounter: Payer: BC Managed Care – PPO | Admitting: Obstetrics and Gynecology

## 2021-04-25 ENCOUNTER — Encounter: Payer: Self-pay | Admitting: Obstetrics and Gynecology

## 2021-04-25 ENCOUNTER — Other Ambulatory Visit: Payer: Self-pay

## 2021-04-25 ENCOUNTER — Ambulatory Visit: Payer: BC Managed Care – PPO | Admitting: Obstetrics and Gynecology

## 2021-04-25 VITALS — BP 126/66 | HR 92 | Ht 65.0 in | Wt 156.5 lb

## 2021-04-25 DIAGNOSIS — Z7689 Persons encountering health services in other specified circumstances: Secondary | ICD-10-CM | POA: Diagnosis not present

## 2021-04-25 MED ORDER — TRAZODONE HCL 50 MG PO TABS: 50.0000 mg | ORAL_TABLET | Freq: Every evening | ORAL | 1 refills | Status: DC | PRN

## 2021-04-25 MED ORDER — PHENTERMINE HCL 15 MG PO CAPS: ORAL_CAPSULE | ORAL | 1 refills | Status: DC

## 2021-04-25 NOTE — Progress Notes (Signed)
    GYNECOLOGY PROGRESS NOTE  Subjective:    Patient ID: Kathleen Dennis, female    DOB: [birth year], 55 y.o.   MRN: [id number]  HPI  Patient is a 55 y.o. female who presents for weight management follow up. She has a h/o HTN, pre-diabetes, and dyslipidemia. She discontinued Phentermine after in June after reaching a plateau.  She had used Phentermine for at low dosing for ~ 5.5 months. Still currently utilizing Topamax.   Patient notes that she also needs a refill on her Topamax.   Current interventions:  Diet - Is cutting carbohydrates, watching portions, increasing protein . Monitoring portions.  Activity - Notes decrease in physical activity (down to 1-2 times per week for past 3 weeks due to work schedule) but now has returned back to 2-4 days per week.  Reports bowel movements have improved, more regular.      The following portions of the patient's history were reviewed and updated as appropriate: allergies, current medications, past family history, past medical history, past social history, past surgical history and problem list.  Review of Systems Pertinent items noted in HPI and remainder of comprehensive ROS otherwise negative.   Objective:    Vitals with BMI 04/25/2021 11/22/2020 10/25/2020  Height 5\' 5"  5\' 5"  5\' 5"   Weight 156 lbs 8 oz 157 lbs 11 oz 155 lbs 11 oz  BMI 26.04 26.24 25.91  Systolic 126 165  Diastolic 66 92 73  Pulse 92 73 81   General appearance: alert and no distress.  Abdomen: soft, non-tender.  Waist circumference 36 inches, BP 126/66   Pulse 92   Ht 5\' 5"  (1.651 m)   Wt 156 lb 8 oz (71 kg)   LMP 06/17/2013 (Approximate)   BMI 26.04 kg/m    Labs:   Lab Results  Component Value Date   CHOL 221 (H) 10/25/2020   HDL 49 10/25/2020   LDLCALC 107 (H) 10/25/2020   LDLDIRECT 95.0 03/05/2018   TRIG 384 (H) 10/25/2020   CHOLHDL 4.5 (H) 10/25/2020   Lab Results  Component Value Date   HGBA1C 5.7 (H) 10/25/2020     Assessment:    1. Encounter for weight management    Plan:   Patient has managed to maintain weight over the past several months despite no further use of Phentermine, however has not been able to lose any more.  Advised to continue her exercise and dietary regimen. Patient currently in overweight category, discussed that she does not meet the criteria to resume Phentermine however patient concerned for no further weight loss and upcoming holidays interrupting her weight loss goals. Will refill for 1 month to get through holidays. Once BMI reaches 25, can discontinue.  Can continue use of Topamax.   Dyslipidemia - patient resumed taking her Zetia in June.  Will repeat at next visit.  Prediabetes - has slightly improved since last check (5.7, down from 5.8).   RTC in 2 month for weight check.    A total of 15 minutes were spent face-to-face with the patient during this encounter and over half of that time dealt with counseling and coordination of care.   12/25/2020, MD [facility]

## 2021-04-26 ENCOUNTER — Encounter: Payer: Self-pay | Admitting: Obstetrics and Gynecology

## 2021-05-17 ENCOUNTER — Other Ambulatory Visit: Payer: Self-pay | Admitting: Obstetrics and Gynecology

## 2021-07-13 ENCOUNTER — Other Ambulatory Visit: Payer: Self-pay

## 2021-07-13 ENCOUNTER — Ambulatory Visit: Payer: BC Managed Care – PPO | Admitting: Obstetrics and Gynecology

## 2021-07-13 ENCOUNTER — Encounter: Payer: Self-pay | Admitting: Obstetrics and Gynecology

## 2021-07-13 ENCOUNTER — Encounter: Payer: BC Managed Care – PPO | Admitting: Obstetrics and Gynecology

## 2021-07-13 VITALS — BP 168/102 | HR 78 | Ht 65.0 in | Wt 158.0 lb

## 2021-07-13 DIAGNOSIS — E663 Overweight: Secondary | ICD-10-CM

## 2021-07-13 DIAGNOSIS — I10 Essential (primary) hypertension: Secondary | ICD-10-CM | POA: Diagnosis not present

## 2021-07-13 DIAGNOSIS — E785 Hyperlipidemia, unspecified: Secondary | ICD-10-CM

## 2021-07-13 DIAGNOSIS — R7303 Prediabetes: Secondary | ICD-10-CM

## 2021-07-13 MED ORDER — OZEMPIC (0.25 OR 0.5 MG/DOSE) 2 MG/1.5ML ~~LOC~~ SOPN: PEN_INJECTOR | SUBCUTANEOUS | 6 refills | Status: AC

## 2021-07-13 NOTE — Progress Notes (Signed)
° ° °  GYNECOLOGY PROGRESS NOTE  Subjective:    Patient ID: Kathleen Dennis, female    DOB: [birth year], 56 y.o.   MRN: [id number]  HPI  Patient is a 56 y.o. female who presents for 2 month weight management follow up. She has a h/o HTN, pre-diabetes, and dyslipidemia. Currently utilizing Topamax and resumed Phentermine 1 month ago after taking 5-6 month hiatus.     Current interventions:  Diet - Notes she was not as compliant with diet since the holidays. Now getting back on track with diet.  Activity - Notes decrease in physical activity over the holidays, but now has recently returned back to 2-4 days per week.  Normal bowel movements.     The following portions of the patient's history were reviewed and updated as appropriate: allergies, current medications, past family history, past medical history, past social history, past surgical history and problem list.  Review of Systems Pertinent items noted in HPI and remainder of comprehensive ROS otherwise negative.   Objective:    Vitals with BMI 07/13/2021 04/25/2021 11/22/2020  Height 5\' 5"  5\' 5"  5\' 5"   Weight 158 lbs 156 lbs 8 oz 157 lbs 11 oz  BMI 26.29 [id number] A999333  Systolic A999333 [id number]  Diastolic 95 66 92  Pulse 78 92 73   Repeat BP after visit 168/102 General appearance: alert and no distress.  Abdomen: soft, non-tender.  Waist circumference 36 inches,    Labs:   Lab Results  Component Value Date   CHOL 221 (H) 10/25/2020   HDL 49 10/25/2020   LDLCALC 107 (H) 10/25/2020   LDLDIRECT 95.0 03/05/2018   TRIG 384 (H) 10/25/2020   CHOLHDL 4.5 (H) 10/25/2020   Lab Results  Component Value Date   HGBA1C 5.7 (H) 10/25/2020     Assessment:   Encounter for weight loss Overweight Essential HTN  Plan:   Patient with small amount of weight gain since last visit.  Currently in overweight category.  Patient with elevated blood pressures this visit, usually controlled.  Would not recommend resuming phentermine  even at low dose at this time.  Discussed alternative options for continued weight management, including use of GLP-1. Will prescribe Ozempic as patient also with prediabetes.  Discussed risk and benefits of medication.  Dyslipidemia - Patient resumed her Zetia ~ 6 months ago.  Will repeat at next visit.  Prediabetes - will need to recheck levels however lab is closing. Will check next visit. See above discussion on weight loss medication that can also improve prediabetes.  Hypertension - currently uncontrolled, however usually is controlled.  Patient notes compliance with medication.  Advised that due to her hypertension as well as her BMI, is not a candidate to continue use of the phentermine at this time. RTC in 1 month for weight check.    A total of 15 minutes were spent face-to-face with the patient during this encounter and over half of that time dealt with counseling and coordination of care.   Rubie Maid, MD [facility]

## 2021-07-17 ENCOUNTER — Encounter: Payer: Self-pay | Admitting: Obstetrics and Gynecology

## 2021-07-17 DIAGNOSIS — D225 Melanocytic nevi of trunk: Secondary | ICD-10-CM | POA: Diagnosis not present

## 2021-07-17 DIAGNOSIS — D2262 Melanocytic nevi of left upper limb, including shoulder: Secondary | ICD-10-CM | POA: Diagnosis not present

## 2021-07-17 DIAGNOSIS — D2261 Melanocytic nevi of right upper limb, including shoulder: Secondary | ICD-10-CM | POA: Diagnosis not present

## 2021-07-17 DIAGNOSIS — D2271 Melanocytic nevi of right lower limb, including hip: Secondary | ICD-10-CM | POA: Diagnosis not present

## 2021-07-25 ENCOUNTER — Encounter: Payer: Self-pay | Admitting: Obstetrics and Gynecology

## 2021-08-01 ENCOUNTER — Encounter: Payer: Self-pay | Admitting: Obstetrics and Gynecology

## 2021-08-10 ENCOUNTER — Other Ambulatory Visit: Payer: Self-pay | Admitting: Obstetrics and Gynecology

## 2021-08-14 ENCOUNTER — Other Ambulatory Visit: Payer: Self-pay

## 2021-08-14 ENCOUNTER — Encounter: Payer: Self-pay | Admitting: Obstetrics and Gynecology

## 2021-08-14 ENCOUNTER — Ambulatory Visit: Payer: BC Managed Care – PPO | Admitting: Obstetrics and Gynecology

## 2021-08-14 VITALS — BP 135/78 | HR 79 | Resp 16 | Ht 65.0 in | Wt 156.1 lb

## 2021-08-14 DIAGNOSIS — R7303 Prediabetes: Secondary | ICD-10-CM

## 2021-08-14 DIAGNOSIS — E785 Hyperlipidemia, unspecified: Secondary | ICD-10-CM

## 2021-08-14 DIAGNOSIS — I10 Essential (primary) hypertension: Secondary | ICD-10-CM | POA: Diagnosis not present

## 2021-08-14 DIAGNOSIS — E663 Overweight: Secondary | ICD-10-CM | POA: Diagnosis not present

## 2021-08-14 NOTE — Telephone Encounter (Signed)
Patient was seen today. Aware that Ozempic was denied.

## 2021-08-14 NOTE — Progress Notes (Signed)
° ° °  GYNECOLOGY PROGRESS NOTE  Subjective:    Patient ID: Kathleen Dennis, female    DOB: [birth year], 56 y.o.   MRN: [id number]  HPI  Patient is a 56 y.o. female who presents for 1 month weight management follow up. She has a h/o HTN, pre-diabetes, and dyslipidemia. Currently utilizing Topamax and resumed Phentermine 2 months ago after taking 5-6 month hiatus, however had to discontinue only after 1 month of use due to significantly elevated BPs. Attempted to prescribe Ozempic last month to continue weight loss efforts, however insurance denied.     Current interventions:  Diet - Low fat, high protein diet.  Activity - Exercising 2-4 days per week.  Normal bowel movements.     The following portions of the patient's history were reviewed and updated as appropriate: allergies, current medications, past family history, past medical history, past social history, past surgical history, and problem list.  Review of Systems Pertinent items noted in HPI and remainder of comprehensive ROS otherwise negative.   Objective:    Vitals with BMI 08/14/2021 07/13/2021 07/13/2021  Height 5\' 5"  - 5\' 5"   Weight 156 lbs 2 oz - 158 lbs  BMI 25.98 - 26.29  Systolic 135 168  Diastolic 78 102 95  Pulse 79 - 78    General appearance: alert and no distress.  Abdomen: soft, non-tender.      Labs:   Lab Results  Component Value Date   CHOL 221 (H) 10/25/2020   HDL 49 10/25/2020   LDLCALC 107 (H) 10/25/2020   LDLDIRECT 95.0 03/05/2018   TRIG 384 (H) 10/25/2020   CHOLHDL 4.5 (H) 10/25/2020   Lab Results  Component Value Date   HGBA1C 5.7 (H) 10/25/2020     Assessment:   Encounter for weight loss Overweight Essential HTN Prediabetes Dyslipidemia Plan:   Patient with normal BPs today since being of Phentermine again.  Also has lost several more lbs, now essentially down to normal weight category.  Discussed that at this time medically she does not need to consider use of  additional agents for weight loss.   Dyslipidemia - Patient resumed her Zetia ~ 8 months ago.  Will return for fasting labs next week.   Prediabetes - will need to recheck levels. If glucose levels are still elevated, could reconsider use of GLP-1 agent, which can improve levels as wll as aid with additional weight loss.  Hypertension - currently controlled again now that she is off of the Phentermine.  RTC in 1 month for weight check as needed.    A total of 15 minutes were spent face-to-face with the patient during this encounter and over half of that time dealt with counseling and coordination of care.   12/25/2020, MD [facility]

## 2021-08-17 ENCOUNTER — Other Ambulatory Visit: Payer: Self-pay | Admitting: Obstetrics and Gynecology

## 2021-08-18 ENCOUNTER — Other Ambulatory Visit: Payer: Self-pay | Admitting: Obstetrics and Gynecology

## 2021-08-21 ENCOUNTER — Other Ambulatory Visit: Payer: Self-pay | Admitting: Obstetrics and Gynecology

## 2021-08-24 ENCOUNTER — Other Ambulatory Visit: Payer: BC Managed Care – PPO

## 2021-09-13 ENCOUNTER — Other Ambulatory Visit: Payer: BC Managed Care – PPO

## 2021-09-13 DIAGNOSIS — E785 Hyperlipidemia, unspecified: Secondary | ICD-10-CM

## 2021-09-13 DIAGNOSIS — R7303 Prediabetes: Secondary | ICD-10-CM | POA: Diagnosis not present

## 2021-09-14 LAB — LIPID PANEL
Chol/HDL Ratio: 4.5 ratio — ABNORMAL HIGH (ref 0.0–4.4)
Cholesterol, Total: 261 mg/dL — ABNORMAL HIGH (ref 100–199)
HDL: 58 mg/dL (ref >39)
LDL Chol Calc (NIH): 141 mg/dL — ABNORMAL HIGH (ref 0–99)
Triglycerides: 340 mg/dL — ABNORMAL HIGH (ref 0–149)
VLDL Cholesterol Cal: 62 mg/dL — ABNORMAL HIGH (ref 5–40)

## 2021-09-14 LAB — HEMOGLOBIN A1C
Est. average glucose Bld gHb Est-mCnc: 120 mg/dL
Hgb A1c MFr Bld: 5.8 % — ABNORMAL HIGH (ref 4.8–5.6)

## 2021-10-10 ENCOUNTER — Other Ambulatory Visit: Payer: Self-pay | Admitting: Obstetrics and Gynecology

## 2021-10-10 DIAGNOSIS — E785 Hyperlipidemia, unspecified: Secondary | ICD-10-CM

## 2021-11-15 ENCOUNTER — Other Ambulatory Visit: Payer: Self-pay | Admitting: Obstetrics and Gynecology

## 2021-11-15 DIAGNOSIS — I10 Essential (primary) hypertension: Secondary | ICD-10-CM

## 2021-11-22 ENCOUNTER — Other Ambulatory Visit: Payer: Self-pay | Admitting: Obstetrics and Gynecology

## 2022-01-01 IMAGING — MG DIGITAL SCREENING BREAST BILAT IMPLANT W/ TOMO W/ CAD
8 of 12 series · 8 of 28 positions shown · non-contrast
Comparison: Previous exam(s).

CLINICAL DATA: Screening.

EXAM:
DIGITAL SCREENING BILATERAL MAMMOGRAM WITH IMPLANTS, CAD AND TOMO
The patient has retropectoral implants. Standard and implant
displaced views were performed.

[R CC]
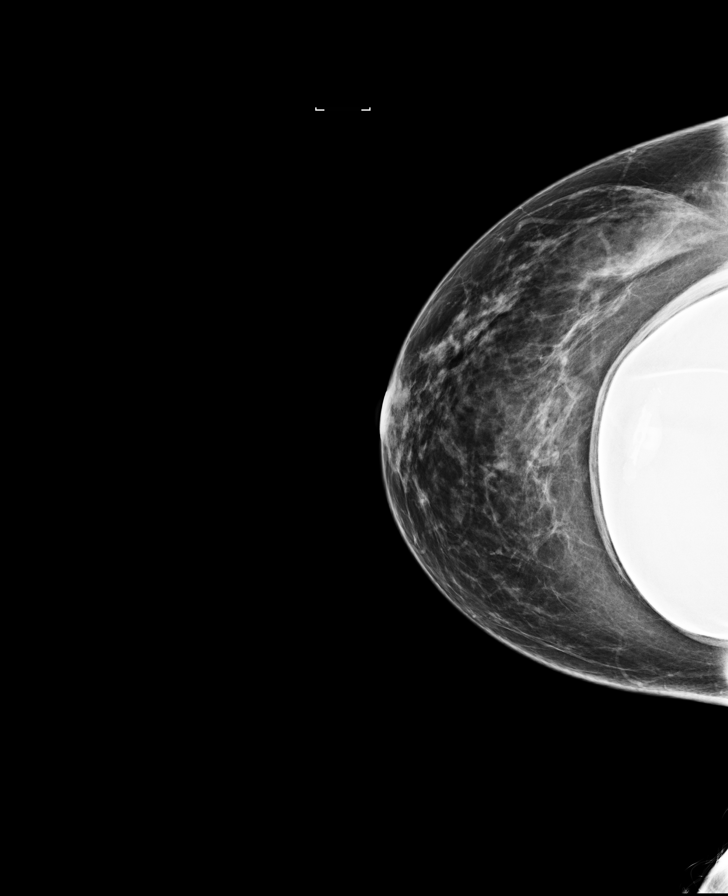

[L MLO]
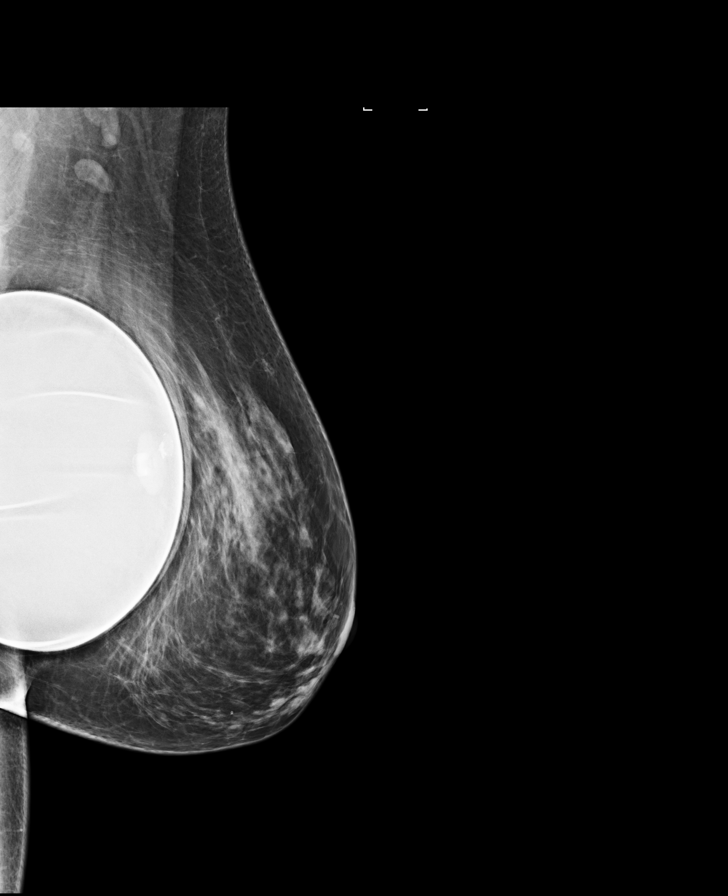

[L CC]
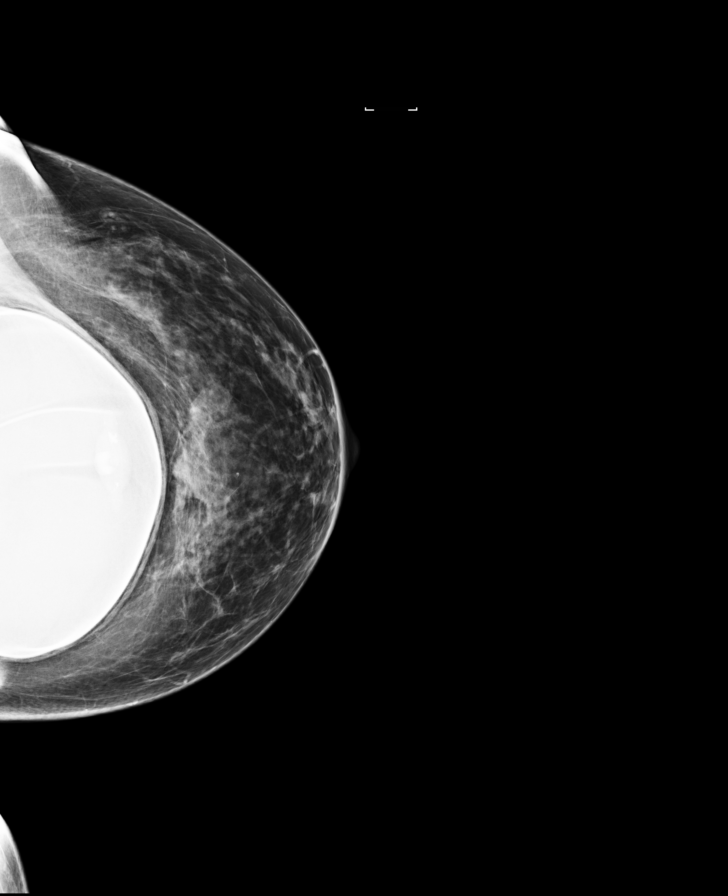

[R MLO]
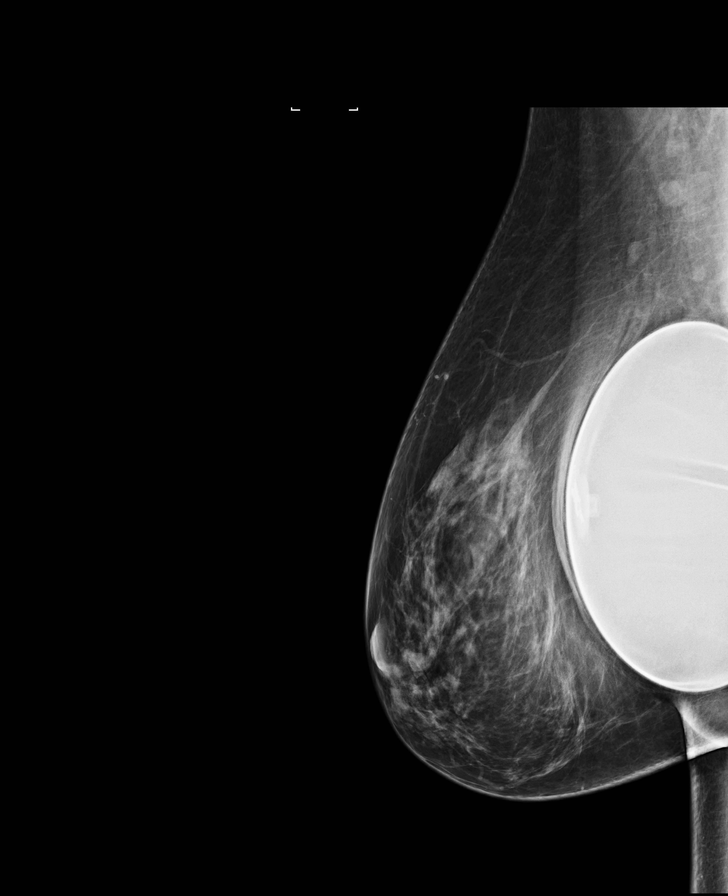

[L MLO synth-2D]
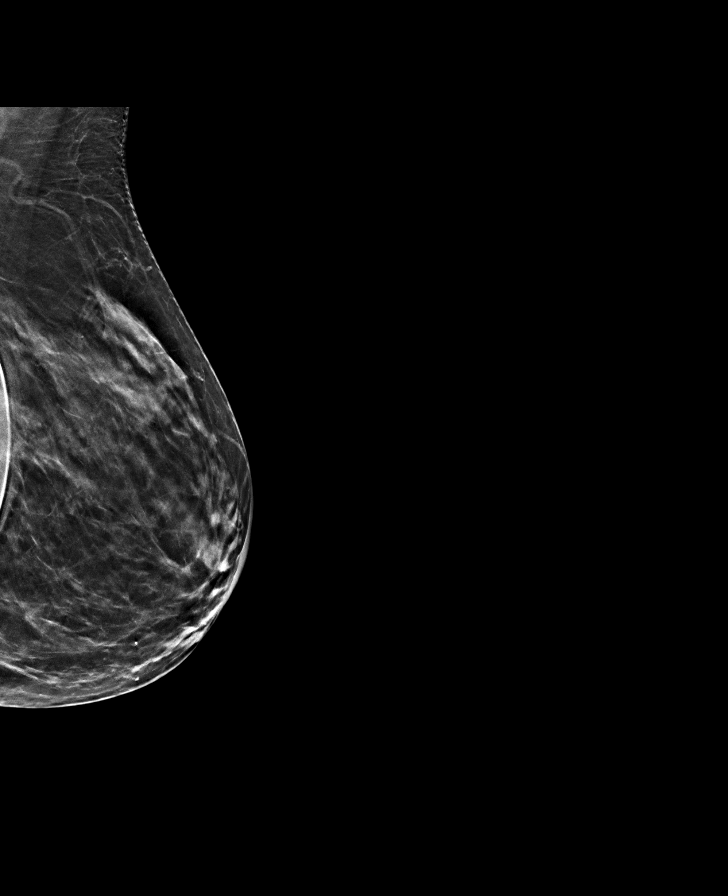

[R MLO synth-2D]
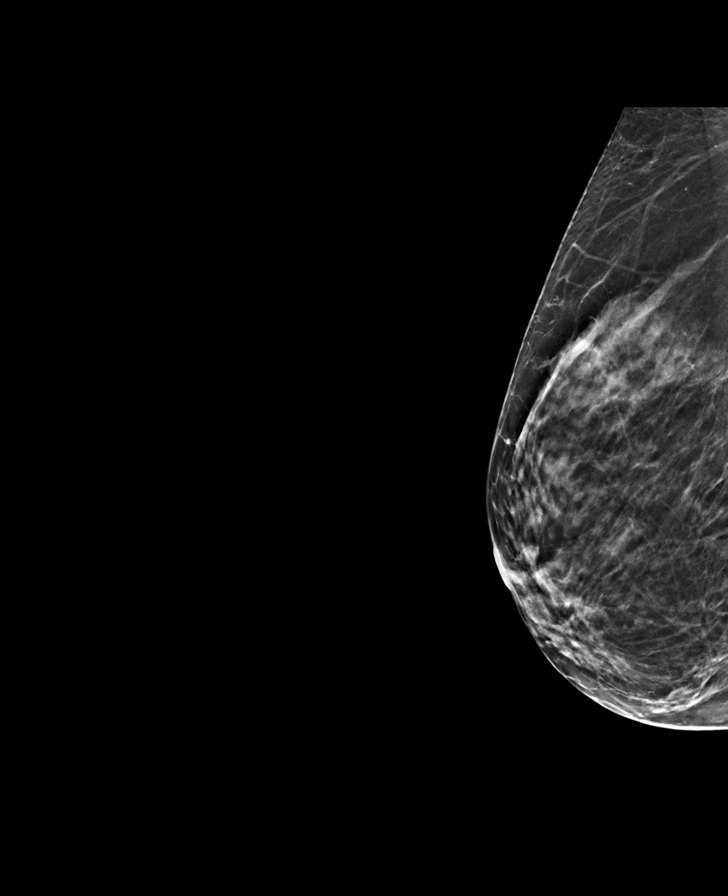

[L CC synth-2D]
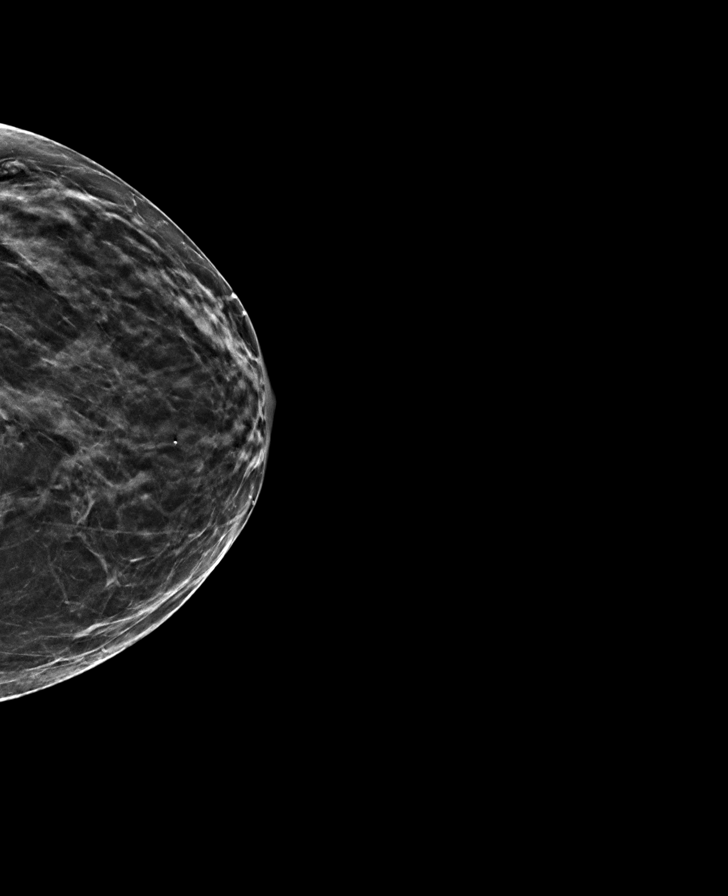

[R CC synth-2D]
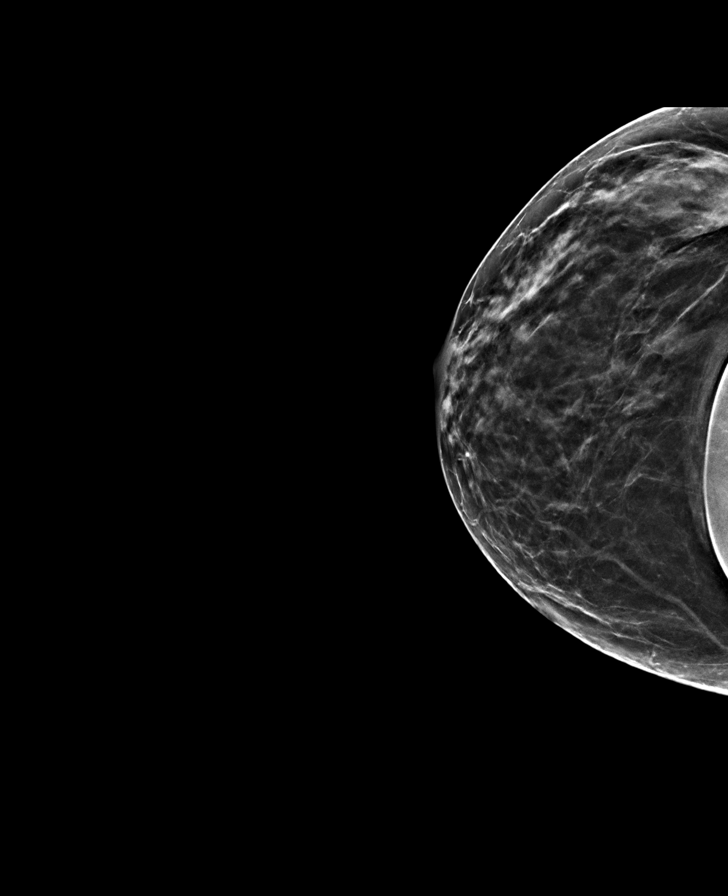

[8 of 28 positions shown; findings below may reference images not displayed]

ACR Breast Density Category c: The breast tissue is heterogeneously
dense, which may obscure small masses.
FINDINGS: There are no findings suspicious for malignancy. Images were
processed with CAD.
IMPRESSION: No mammographic evidence of malignancy. A result letter of this
screening mammogram will be mailed directly to the patient.

RECOMMENDATION:
Screening mammogram in one year. (Code:49-[id number])

BI-RADS CATEGORY  1:  Negative.

## 2022-03-26 ENCOUNTER — Encounter: Payer: Self-pay | Admitting: Obstetrics and Gynecology

## 2022-04-23 ENCOUNTER — Encounter: Payer: Self-pay | Admitting: Obstetrics and Gynecology

## 2022-04-23 ENCOUNTER — Ambulatory Visit (INDEPENDENT_AMBULATORY_CARE_PROVIDER_SITE_OTHER): Payer: BC Managed Care – PPO | Admitting: Obstetrics and Gynecology

## 2022-04-23 ENCOUNTER — Other Ambulatory Visit (HOSPITAL_COMMUNITY)
Admission: RE | Admit: 2022-04-23 | Discharge: 2022-04-23 | Disposition: A | Discharge status: Home / Self Care | Payer: BC Managed Care – PPO | Source: Ambulatory Visit | Attending: Obstetrics and Gynecology | Admitting: Obstetrics and Gynecology

## 2022-04-23 VITALS — BP 156/91 | HR 84 | Resp 16 | Ht 65.5 in | Wt 164.1 lb

## 2022-04-23 DIAGNOSIS — Z1159 Encounter for screening for other viral diseases: Secondary | ICD-10-CM

## 2022-04-23 DIAGNOSIS — Z113 Encounter for screening for infections with a predominantly sexual mode of transmission: Secondary | ICD-10-CM | POA: Diagnosis not present

## 2022-04-23 DIAGNOSIS — Z01419 Encounter for gynecological examination (general) (routine) without abnormal findings: Secondary | ICD-10-CM | POA: Insufficient documentation

## 2022-04-23 DIAGNOSIS — Z124 Encounter for screening for malignant neoplasm of cervix: Secondary | ICD-10-CM

## 2022-04-23 DIAGNOSIS — R7303 Prediabetes: Secondary | ICD-10-CM | POA: Diagnosis not present

## 2022-04-23 DIAGNOSIS — N951 Menopausal and female climacteric states: Secondary | ICD-10-CM | POA: Diagnosis not present

## 2022-04-23 DIAGNOSIS — Z1211 Encounter for screening for malignant neoplasm of colon: Secondary | ICD-10-CM

## 2022-04-23 DIAGNOSIS — N952 Postmenopausal atrophic vaginitis: Secondary | ICD-10-CM

## 2022-04-23 DIAGNOSIS — E785 Hyperlipidemia, unspecified: Secondary | ICD-10-CM

## 2022-04-23 DIAGNOSIS — Z1231 Encounter for screening mammogram for malignant neoplasm of breast: Secondary | ICD-10-CM

## 2022-04-23 MED ORDER — PREMARIN 0.625 MG/GM VA CREA: 1.0000 | TOPICAL_CREAM | Freq: Every day | VAGINAL | 0 refills | Status: DC

## 2022-04-23 MED ORDER — PREMARIN 0.625 MG/GM VA CREA: 1.0000 | TOPICAL_CREAM | Freq: Every day | VAGINAL | 12 refills | Status: DC

## 2022-04-23 NOTE — Progress Notes (Signed)
ANNUAL PREVENTATIVE CARE GYNECOLOGY  ENCOUNTER NOTE  Subjective:       Kathleen Dennis is a 56 y.o. G52P0010 female here for a routine annual gynecologic exam. The patient is sexually active. The patient is not taking hormone replacement therapy. Patient denies post-menopausal vaginal bleeding. The patient wears seatbelts: yes. The patient participates in regular exercise: yes. Has the patient ever been transfused or tattooed?: yes. The patient reports that there is not domestic violence in her life.  Current complaints: 1.  Reports discomfort with sexual activity with therapy has been for a little while little.  Wonders if there is anything that can be done.  Patient reports that she "feels like I am a virgin all over again".   Gynecologic History Patient's last menstrual period was 06/17/2013 (approximate). Contraception: post menopausal status Last Pap: 08/24/2018. Results were: abnormal Last mammogram: 08/20/2019. Results were: normal Last Colonoscopy: Never done   Obstetric History OB History  Gravida Para Term Preterm AB Living  1       1    SAB IAB Ectopic Multiple Live Births  1            # Outcome Date GA Lbr Len/2nd Weight Sex Delivery Anes PTL Lv  1 SAB 1989            Past Medical History:  Diagnosis Date   ADHD (attention deficit hyperactivity disorder) 07/16/2016   Anxiety    Elevated cholesterol with high triglycerides 07/17/2017   GERD (gastroesophageal reflux disease) 07/16/2016   Hot flashes    Hypertension    since 2004 on meds since 2010    Family History  Problem Relation Age of Onset   Thyroid disease Mother        hyperthyroidism   Diabetes Father    Hypertension Father    Hyperlipidemia Father    Heart disease Maternal Grandmother    Depression Maternal Grandmother    Heart disease Maternal Grandfather     Past Surgical History:  Procedure Laterality Date   AUGMENTATION MAMMAPLASTY Bilateral 1997   BREAST ENHANCEMENT SURGERY       Social History   Socioeconomic History   Marital status: Married    Spouse name: Not on file   Number of children: Not on file   Years of education: Not on file   Highest education level: Not on file  Occupational History   Not on file  Tobacco Use   Smoking status: Light Smoker    Packs/day: 0.25    Years: 5.00    Total pack years: 1.25    Types: Cigarettes   Smokeless tobacco: Never  Vaping Use   Vaping Use: Never used  Substance and Sexual Activity   Alcohol use: Not Currently    Comment: socially   Drug use: No   Sexual activity: Yes    Birth control/protection: Post-menopausal  Other Topics Concern   Not on file  Social History Narrative   Married    Surveyor, minerals to [employer]    No guns, wears selt belt, feels safe in relationship    Social Determinants of Health   Financial Resource Strain: Not on file  Food Insecurity: Not on file  Transportation Needs: Not on file  Physical Activity: Unknown (04/21/2017)   Exercise Vital Sign    Days of Exercise per Week: Patient refused    Minutes of Exercise per Session: Patient refused  Stress: No Stress Concern Present (04/21/2017)   [employer] [place] -  Occupational Stress Questionnaire    Feeling of Stress : Not at all  Social Connections: Unknown (04/21/2017)   Social Connection and Isolation Panel [NHANES]    Frequency of Communication with Friends and Family: Patient refused    Frequency of Social Gatherings with Friends and Family: Patient refused    Attends Religious Services: Patient refused    Active Member of Clubs or Organizations: Patient refused    Attends Archivist Meetings: Patient refused    Marital Status: Patient refused  Intimate Partner Violence: Unknown (04/21/2017)   Humiliation, Afraid, Rape, and Kick questionnaire    Fear of Current or Ex-Partner: Patient refused    Emotionally Abused: Patient refused    Physically Abused: Patient refused     Sexually Abused: Patient refused    Current Outpatient Medications on File Prior to Visit  Medication Sig Dispense Refill   cetirizine (ZYRTEC) 10 MG tablet Take by mouth.     ezetimibe (ZETIA) 10 MG tablet TAKE 1 TABLET BY MOUTH DAILY.**NEED APPTFOR FUTHER REFILLS CALL THE OFFICE [id number] 90 tablet 3   hydrochlorothiazide (HYDRODIURIL) 25 MG tablet TAKE ONE TABLET BY MOUTH EVERY DAY 90 tablet 3   losartan (COZAAR) 100 MG tablet TAKE 1 TABLET BY MOUTH DAILY. 90 tablet 3   Multiple Vitamin (MULTI VITAMIN DAILY PO) Take by mouth.     omeprazole (PRILOSEC) 40 MG capsule TAKE 1 CAPSULE BY MOUTH DAILY. 90 capsule 3   PARoxetine (PAXIL) 20 MG tablet TAKE 1 TABLET BY MOUTH EVERY DAY. 90 tablet 3   topiramate (TOPAMAX) 50 MG tablet TAKE ONE TABLET BY MOUTH TWICE DAILY 180 tablet 3   traZODone (DESYREL) 50 MG tablet Take 1 tablet (50 mg total) by mouth at bedtime as needed for sleep. 90 tablet 1   Triamcinolone Acetonide (TRIAMCINOLONE 0.1 % CREAM : EUCERIN) CREA Apply 1 application topically 2 (two) times daily. (Patient taking differently: Apply 1 application topically 2 (two) times daily as needed.) 1 each 0   No current facility-administered medications on file prior to visit.    Allergies  Allergen Reactions   Sulfa Antibiotics Hives      Review of Systems ROS Review of Systems - General ROS: negative for - chills, fatigue, fever, hot flashes, night sweats, weight gain or weight loss Psychological ROS: negative for - anxiety, decreased libido, depression, mood swings, physical abuse or sexual abuse Ophthalmic ROS: negative for - blurry vision, eye pain or loss of vision ENT ROS: negative for - headaches, hearing change, visual changes or vocal changes Allergy and Immunology ROS: negative for - hives, itchy/watery eyes or seasonal allergies Hematological and Lymphatic ROS: negative for - bleeding problems, bruising, swollen lymph nodes or weight loss Endocrine ROS: negative for -  galactorrhea, hair pattern changes, hot flashes, malaise/lethargy, mood swings, palpitations, polydipsia/polyuria, skin changes, temperature intolerance or unexpected weight changes Breast ROS: negative for - new or changing breast lumps or nipple discharge Respiratory ROS: negative for - cough or shortness of breath Cardiovascular ROS: negative for - chest pain, irregular heartbeat, palpitations or shortness of breath Gastrointestinal ROS: no abdominal pain, change in bowel habits, or black or bloody stools Genito-Urinary ROS: no dysuria, trouble voiding, or hematuria Musculoskeletal ROS: negative for - joint pain or joint stiffness Neurological ROS: negative for - bowel and bladder control changes Dermatological ROS: negative for rash and skin lesion changes   Objective:   BP (!) 156/91   Pulse 84   Resp 16   Ht 5' 5.5" (1.664 m)  Wt 164 lb 1.6 oz (74.4 kg)   LMP 06/17/2013 (Approximate)   SpO2 97%   BMI 26.89 kg/m  CONSTITUTIONAL: Well-developed, well-nourished female in no acute distress.  PSYCHIATRIC: Normal mood and affect. Normal behavior. Normal judgment and thought content. [place]: Alert and oriented to person, place, and time. Normal muscle tone coordination. No cranial nerve deficit noted. HENT:  Normocephalic, atraumatic, External right and left ear normal. Oropharynx is clear and moist EYES: Conjunctivae and EOM are normal. Pupils are equal, round, and reactive to light. No scleral icterus.  NECK: Normal range of motion, supple, no masses.  Normal thyroid.  SKIN: Skin is warm and dry. No rash noted. Not diaphoretic. No erythema. No pallor. CARDIOVASCULAR: Normal heart rate noted, regular rhythm, no murmur. RESPIRATORY: Clear to auscultation bilaterally. Effort and breath sounds normal, no problems with respiration noted. BREASTS: Symmetric in size. No masses, skin changes, nipple drainage, or lymphadenopathy. ABDOMEN: Soft, normal bowel sounds, no distention noted.  No  tenderness, rebound or guarding.  BLADDER: Normal PELVIC:  Bladder no bladder distension noted  Urethra: normal appearing urethra with no masses, tenderness or lesions  Vulva: normal appearing vulva with no masses, tenderness or lesions  Vagina: moderately atrophic, no lesions or discharge  Cervix: normal appearing cervix without discharge or lesions  Uterus: uterus is normal size, shape, consistency and nontender  Adnexa: normal adnexa in size, nontender and no masses  RV: External Exam NormaI, No Rectal Masses, and Normal Sphincter tone  MUSCULOSKELETAL: Normal range of motion. No tenderness.  No cyanosis, clubbing, or edema.  2+ distal pulses. LYMPHATIC: No Axillary, Supraclavicular, or Inguinal Adenopathy.   Labs: Lab Results  Component Value Date   WBC 6.8 10/25/2020   HGB 14.1 10/25/2020   HCT 41.0 10/25/2020   MCV 97 10/25/2020   PLT 246 10/25/2020    Lab Results  Component Value Date   CREATININE 0.78 10/25/2020   BUN 12 10/25/2020   NA 137 10/25/2020   K 4.8 10/25/2020   CL 97 10/25/2020   CO2 24 10/25/2020    Lab Results  Component Value Date   ALT 35 (H) 10/25/2020   AST 32 10/25/2020   ALKPHOS 94 10/25/2020   BILITOT 0.3 10/25/2020    Lab Results  Component Value Date   CHOL 261 (H) 09/13/2021   HDL 58 09/13/2021   LDLCALC 141 (H) 09/13/2021   LDLDIRECT 95.0 03/05/2018   TRIG 340 (H) 09/13/2021   CHOLHDL 4.5 (H) 09/13/2021    Lab Results  Component Value Date   TSH 1.930 10/25/2020    Lab Results  Component Value Date   HGBA1C 5.8 (H) 09/13/2021     Assessment:   1. Encounter for well woman exam with routine gynecological exam   2. Hyperlipidemia, unspecified hyperlipidemia type   3. Prediabetes   4. Breast cancer screening by mammogram   5. Colon cancer screening   6. Cervical cancer screening   7. Encounter for hepatitis C screening test for low risk patient   8. Vaginal atrophy   9. Vasomotor symptoms due to menopause       Plan:  Pap:  performed today. Mammogram: Ordered Colon Screening:  Ordered. Will perform colonoscopy this year. Patient performed Cologuard several years ago.  Labs:  Ordered . One time Hep C screening also ordered.  Routine preventative health maintenance measures emphasized: Exercise/Diet/Weight control, Tobacco Warnings, Alcohol/Substance use risks, Stress Management, Peer Pressure Issues, and Safe Sex COVID Vaccination status: Declined Flu Vaccine: Declined Vasomotor symptoms currently  being managed with Paxil.  Vaginal atrophy, discussed management options, ok to trial Premarin cream, samples given.  Prediabetes, Dyslipidemia has not seen PCP in a while. Will check labs.   Return to [place]. RTC in 6-8 weeks for reassessment of symptoms.    Rubie Maid, MD [place]

## 2022-04-23 NOTE — Patient Instructions (Addendum)
Preventive Care 40-54 Years Old, Female Preventive care refers to lifestyle choices and visits with your health care provider that can promote health and wellness. Preventive care visits are also called wellness exams. What can I expect for my preventive care visit? Counseling Your health care provider may ask you questions about your: Medical history, including: Past medical problems. Family medical history. Pregnancy history. Current health, including: Menstrual cycle. Method of birth control. Emotional well-being. Home life and relationship well-being. Sexual activity and sexual health. Lifestyle, including: Alcohol, nicotine or tobacco, and drug use. Access to firearms. Diet, exercise, and sleep habits. Work and work environment. Sunscreen use. Safety issues such as seatbelt and bike helmet use. Physical exam Your health care provider will check your: Height and weight. These may be used to calculate your BMI (body mass index). BMI is a measurement that tells if you are at a healthy weight. Waist circumference. This measures the distance around your waistline. This measurement also tells if you are at a healthy weight and may help predict your risk of certain diseases, such as type 2 diabetes and high blood pressure. Heart rate and blood pressure. Body temperature. Skin for abnormal spots. What immunizations do I need?  Vaccines are usually given at various ages, according to a schedule. Your health care provider will recommend vaccines for you based on your age, medical history, and lifestyle or other factors, such as travel or where you work. What tests do I need? Screening Your health care provider may recommend screening tests for certain conditions. This may include: Lipid and cholesterol levels. Diabetes screening. This is done by checking your blood sugar (glucose) after you have not eaten for a while (fasting). Pelvic exam and Pap test. Hepatitis B test. Hepatitis C  test. HIV (human immunodeficiency virus) test. STI (sexually transmitted infection) testing, if you are at risk. Lung cancer screening. Colorectal cancer screening. Mammogram. Talk with your health care provider about when you should start having regular mammograms. This may depend on whether you have a family history of breast cancer. BRCA-related cancer screening. This may be done if you have a family history of breast, ovarian, tubal, or peritoneal cancers. Bone density scan. This is done to screen for osteoporosis. Talk with your health care provider about your test results, treatment options, and if necessary, the need for more tests. Follow these instructions at home: Eating and drinking  Eat a diet that includes fresh fruits and vegetables, whole grains, lean protein, and low-fat dairy products. Take vitamin and mineral supplements as recommended by your health care provider. Do not drink alcohol if: Your health care provider tells you not to drink. You are pregnant, may be pregnant, or are planning to become pregnant. If you drink alcohol: Limit how much you have to 0-1 drink a day. Know how much alcohol is in your drink. In the [place] one drink equals one 12 oz bottle of beer (355 mL), one 5 oz glass of wine (148 mL), or one 1 oz glass of hard liquor (44 mL). Lifestyle Brush your teeth every morning and night with fluoride toothpaste. Floss one time each day. Exercise for at least 30 minutes 5 or more days each week. Do not use any products that contain nicotine or tobacco. These products include cigarettes, chewing tobacco, and vaping devices, such as e-cigarettes. If you need help quitting, ask your health care provider. Do not use drugs. If you are sexually active, practice safe sex. Use a condom or other form of protection to   prevent STIs. If you do not wish to become pregnant, use a form of birth control. If you plan to become pregnant, see your health care provider for a  prepregnancy visit. Take aspirin only as told by your health care provider. Make sure that you understand how much to take and what form to take. Work with your health care provider to find out whether it is safe and beneficial for you to take aspirin daily. Find healthy ways to manage stress, such as: Meditation, yoga, or listening to music. Journaling. Talking to a trusted person. Spending time with friends and family. Minimize exposure to UV radiation to reduce your risk of skin cancer. Safety Always wear your seat belt while driving or riding in a vehicle. Do not drive: If you have been drinking alcohol. Do not ride with someone who has been drinking. When you are tired or distracted. While texting. If you have been using any mind-altering substances or drugs. Wear a helmet and other protective equipment during sports activities. If you have firearms in your house, make sure you follow all gun safety procedures. Seek help if you have been physically or sexually abused. What's next? Visit your health care provider once a year for an annual wellness visit. Ask your health care provider how often you should have your eyes and teeth checked. Stay up to date on all vaccines. This information is not intended to replace advice given to you by your health care provider. Make sure you discuss any questions you have with your health care provider. Document Revised: 11/29/2020 Document Reviewed: 11/29/2020 Elsevier Patient Education  [place].

## 2022-04-24 ENCOUNTER — Telehealth: Payer: Self-pay

## 2022-04-24 ENCOUNTER — Other Ambulatory Visit: Payer: Self-pay

## 2022-04-24 DIAGNOSIS — Z1211 Encounter for screening for malignant neoplasm of colon: Secondary | ICD-10-CM

## 2022-04-24 LAB — COMPREHENSIVE METABOLIC PANEL
ALT: 40 IU/L — ABNORMAL HIGH (ref 0–32)
AST: 33 IU/L (ref 0–40)
Albumin/Globulin Ratio: 2 (ref 1.2–2.2)
Albumin: 4.8 g/dL (ref 3.8–4.9)
Alkaline Phosphatase: 108 IU/L (ref 44–121)
BUN/Creatinine Ratio: 15 (ref 9–23)
BUN: 12 mg/dL (ref 6–24)
Bilirubin Total: 0.4 mg/dL (ref 0.0–1.2)
CO2: 21 mmol/L (ref 20–29)
Calcium: 10 mg/dL (ref 8.7–10.2)
Chloride: 98 mmol/L (ref 96–106)
Creatinine, Ser: 0.82 mg/dL (ref 0.57–1.00)
Globulin, Total: 2.4 g/dL (ref 1.5–4.5)
Glucose: 120 mg/dL — ABNORMAL HIGH (ref 70–99)
Potassium: 3.5 mmol/L (ref 3.5–5.2)
Sodium: 139 mmol/L (ref 134–144)
Total Protein: 7.2 g/dL (ref 6.0–8.5)
eGFR: 85 mL/min/{1.73_m2} (ref >59)

## 2022-04-24 LAB — CBC
Hematocrit: 41.9 % (ref 34.0–46.6)
Hemoglobin: 14.3 g/dL (ref 11.1–15.9)
MCH: 32.1 pg (ref 26.6–33.0)
MCHC: 34.1 g/dL (ref 31.5–35.7)
MCV: 94 fL (ref 79–97)
Platelets: 219 10*3/uL (ref 150–450)
RBC: 4.46 x10E6/uL (ref 3.77–5.28)
RDW: 11.3 % — ABNORMAL LOW (ref 11.7–15.4)
WBC: 8.8 10*3/uL (ref 3.4–10.8)

## 2022-04-24 LAB — HEMOGLOBIN A1C
Est. average glucose Bld gHb Est-mCnc: 126 mg/dL
Hgb A1c MFr Bld: 6 % — ABNORMAL HIGH (ref 4.8–5.6)

## 2022-04-24 LAB — LIPID PANEL
Chol/HDL Ratio: 5.5 ratio — ABNORMAL HIGH (ref 0.0–4.4)
Cholesterol, Total: 244 mg/dL — ABNORMAL HIGH (ref 100–199)
HDL: 44 mg/dL (ref >39)
LDL Chol Calc (NIH): 101 mg/dL — ABNORMAL HIGH (ref 0–99)
Triglycerides: 587 mg/dL (ref 0–149)
VLDL Cholesterol Cal: 99 mg/dL — ABNORMAL HIGH (ref 5–40)

## 2022-04-24 LAB — HEPATITIS C ANTIBODY: Hep C Virus Ab: NONREACTIVE

## 2022-04-24 LAB — VITAMIN D 25 HYDROXY (VIT D DEFICIENCY, FRACTURES): Vit D, 25-Hydroxy: 27.8 ng/mL — ABNORMAL LOW (ref 30.0–100.0)

## 2022-04-24 LAB — TSH: TSH: 3.23 u[IU]/mL (ref 0.450–4.500)

## 2022-04-24 MED ORDER — NA SULFATE-K SULFATE-MG SULF 17.5-3.13-1.6 GM/177ML PO SOLN: 1.0000 | Freq: Once | ORAL | 0 refills | Status: AC

## 2022-04-24 NOTE — Telephone Encounter (Signed)
Gastroenterology Pre-Procedure Review  Request Date: 07/01/22 Requesting Physician: Dr. Allegra Lai  PATIENT REVIEW QUESTIONS: The patient responded to the following health history questions as indicated:    1. Are you having any GI issues? no 2. Do you have a personal history of Polyps? no 3. Do you have a family history of Colon Cancer or Polyps? no 4. Diabetes Mellitus? no 5. Joint replacements in the past 12 months?no 6. Major health problems in the past 3 months?no 7. Any artificial heart valves, MVP, or defibrillator?no    MEDICATIONS & ALLERGIES:    Patient reports the following regarding taking any anticoagulation/antiplatelet therapy:   Plavix, Coumadin, Eliquis, Xarelto, Lovenox, Pradaxa, Brilinta, or Effient? no Aspirin? no  Patient confirms/reports the following medications:  Current Outpatient Medications  Medication Sig Dispense Refill   cetirizine (ZYRTEC) 10 MG tablet Take by mouth.     conjugated estrogens (PREMARIN) vaginal cream Place 1 Applicatorful vaginally daily. 42.5 g 12   conjugated estrogens (PREMARIN) vaginal cream Place 1 Applicatorful vaginally daily. 4 g 0   ezetimibe (ZETIA) 10 MG tablet TAKE 1 TABLET BY MOUTH DAILY.**NEED APPTFOR FUTHER REFILLS CALL THE OFFICE [phone] 90 tablet 3   hydrochlorothiazide (HYDRODIURIL) 25 MG tablet TAKE ONE TABLET BY MOUTH EVERY DAY 90 tablet 3   losartan (COZAAR) 100 MG tablet TAKE 1 TABLET BY MOUTH DAILY. 90 tablet 3   Multiple Vitamin (MULTI VITAMIN DAILY PO) Take by mouth.     omeprazole (PRILOSEC) 40 MG capsule TAKE 1 CAPSULE BY MOUTH DAILY. 90 capsule 3   PARoxetine (PAXIL) 20 MG tablet TAKE 1 TABLET BY MOUTH EVERY DAY. 90 tablet 3   topiramate (TOPAMAX) 50 MG tablet TAKE ONE TABLET BY MOUTH TWICE DAILY 180 tablet 3   traZODone (DESYREL) 50 MG tablet Take 1 tablet (50 mg total) by mouth at bedtime as needed for sleep. 90 tablet 1   Triamcinolone Acetonide (TRIAMCINOLONE 0.1 % CREAM : EUCERIN) CREA Apply 1 application  topically 2 (two) times daily. (Patient taking differently: Apply 1 application  topically 2 (two) times daily as needed.) 1 each 0   No current facility-administered medications for this visit.    Patient confirms/reports the following allergies:  Allergies  Allergen Reactions   Sulfa Antibiotics Hives    No orders of the defined types were placed in this encounter.   AUTHORIZATION INFORMATION Primary Insurance: 1D#: Group #:  Secondary Insurance: 1D#: Group #:  SCHEDULE INFORMATION: Date:  Time: Location:

## 2022-04-25 LAB — CYTOLOGY - PAP
Comment: NEGATIVE
Diagnosis: NEGATIVE
High risk HPV: NEGATIVE

## 2022-05-18 ENCOUNTER — Other Ambulatory Visit: Payer: Self-pay | Admitting: Obstetrics and Gynecology

## 2022-05-28 ENCOUNTER — Other Ambulatory Visit: Payer: Self-pay | Admitting: Obstetrics and Gynecology

## 2022-06-06 ENCOUNTER — Telehealth: Payer: Self-pay | Admitting: Gastroenterology

## 2022-06-06 NOTE — Telephone Encounter (Signed)
Patient would like to cancel her colonoscopy. States she will call back when she is ready to reschedule.

## 2022-07-01 ENCOUNTER — Ambulatory Visit: Admit: 2022-07-01 | Payer: BC Managed Care – PPO | Admitting: Gastroenterology

## 2022-07-01 SURGERY — COLONOSCOPY WITH PROPOFOL
Anesthesia: General

## 2022-07-02 ENCOUNTER — Ambulatory Visit: Payer: Self-pay | Admitting: Obstetrics and Gynecology

## 2022-07-05 NOTE — Progress Notes (Deleted)
    GYNECOLOGY PROGRESS NOTE  Subjective:    Patient ID: Priscille Heidelberg, female    DOB: Oct 07, 1965, 57 y.o.   MRN: [id number]  HPI  Patient is a 57 y.o. G13P0010 female who presents for medication follow up. She was previous seen for her annual exam, during her annual she complained of discomfort with intercourse. She was dx with vaginal atrophy and given a sample of Premarin Cream to try. Today she reports that the Premarin    {Common ambulatory SmartLinks:19316}  Review of Systems {ros; complete:30496}   Objective:   Last menstrual period 06/17/2013. There is no height or weight on file to calculate BMI. General appearance: {general exam:16600} Abdomen: {abdominal exam:16834} Pelvic: {pelvic exam:16852::"cervix normal in appearance","external genitalia normal","no adnexal masses or tenderness","no cervical motion tenderness","rectovaginal septum normal","uterus normal size, shape, and consistency","vagina normal without discharge"} Extremities: {extremity exam:5109} Neurologic: {neuro exam:17854}   Assessment:   No diagnosis found.   Plan:   There are no diagnoses linked to this encounter.     Rubie Maid, MD [place]

## 2022-07-09 ENCOUNTER — Ambulatory Visit: Payer: Self-pay | Admitting: Obstetrics and Gynecology

## 2022-07-13 ENCOUNTER — Other Ambulatory Visit: Payer: Self-pay | Admitting: Obstetrics and Gynecology

## 2022-07-13 DIAGNOSIS — E785 Hyperlipidemia, unspecified: Secondary | ICD-10-CM

## 2022-10-01 ENCOUNTER — Ambulatory Visit
Admission: RE | Admit: 2022-10-01 | Discharge: 2022-10-01 | Disposition: A | Discharge status: Home / Self Care | Payer: No Typology Code available for payment source | Source: Ambulatory Visit | Attending: Obstetrics and Gynecology | Admitting: Obstetrics and Gynecology

## 2022-10-01 DIAGNOSIS — Z01419 Encounter for gynecological examination (general) (routine) without abnormal findings: Secondary | ICD-10-CM | POA: Insufficient documentation

## 2022-10-01 DIAGNOSIS — Z1231 Encounter for screening mammogram for malignant neoplasm of breast: Secondary | ICD-10-CM | POA: Diagnosis not present

## 2022-10-08 ENCOUNTER — Other Ambulatory Visit: Payer: Self-pay | Admitting: Obstetrics and Gynecology

## 2022-10-08 DIAGNOSIS — E785 Hyperlipidemia, unspecified: Secondary | ICD-10-CM

## 2022-10-17 NOTE — Telephone Encounter (Signed)
Please see if patient has been taking this medication regularly. If she has, she may need a different medication as it is not helping is not helping much to lower her cholesterol.  If she had not been taking it then I would encourage her to take it regularly and can refill the medication.  Also please find out if she has reviewed her MyChart for her other labs which that were abnormal.

## 2022-12-30 ENCOUNTER — Other Ambulatory Visit: Payer: Self-pay | Admitting: Obstetrics and Gynecology

## 2022-12-30 DIAGNOSIS — I10 Essential (primary) hypertension: Secondary | ICD-10-CM

## 2023-07-15 ENCOUNTER — Other Ambulatory Visit: Payer: Self-pay | Admitting: Obstetrics and Gynecology

## 2023-07-23 NOTE — Patient Instructions (Addendum)
 Preventive Care 9-56 Years Old, Female Preventive care refers to lifestyle choices and visits with your health care provider that can promote health and wellness. Preventive care visits are also called wellness exams. What can I expect for my preventive care visit? Counseling Your health care provider may ask you questions about your: Medical history, including: Past medical problems. Family medical history. Pregnancy history. Current health, including: Menstrual cycle. Method of birth control. Emotional well-being. Home life and relationship well-being. Sexual activity and sexual health. Lifestyle, including: Alcohol, nicotine or tobacco, and drug use. Access to firearms. Diet, exercise, and sleep habits. Work and work Astronomer. Sunscreen use. Safety issues such as seatbelt and bike helmet use. Physical exam Your health care provider will check your: Height and weight. These may be used to calculate your BMI (body mass index). BMI is a measurement that tells if you are at a healthy weight. Waist circumference. This measures the distance around your waistline. This measurement also tells if you are at a healthy weight and may help predict your risk of certain diseases, such as type 2 diabetes and high blood pressure. Heart rate and blood pressure. Body temperature. Skin for abnormal spots. What immunizations do I need?  Vaccines are usually given at various ages, according to a schedule. Your health care provider will recommend vaccines for you based on your age, medical history, and lifestyle or other factors, such as travel or where you work. What tests do I need? Screening Your health care provider may recommend screening tests for certain conditions. This may include: Lipid and cholesterol levels. Diabetes screening. This is done by checking your blood sugar (glucose) after you have not eaten for a while (fasting). Pelvic exam and Pap test. Hepatitis B test. Hepatitis C  test. HIV (human immunodeficiency virus) test. STI (sexually transmitted infection) testing, if you are at risk. Lung cancer screening. Colorectal cancer screening. Mammogram. Talk with your health care provider about when you should start having regular mammograms. This may depend on whether you have a family history of breast cancer. BRCA-related cancer screening. This may be done if you have a family history of breast, ovarian, tubal, or peritoneal cancers. Bone density scan. This is done to screen for osteoporosis. Talk with your health care provider about your test results, treatment options, and if necessary, the need for more tests. Follow these instructions at home: Eating and drinking  Eat a diet that includes fresh fruits and vegetables, whole grains, lean protein, and low-fat dairy products. Take vitamin and mineral supplements as recommended by your health care provider. Do not drink alcohol if: Your health care provider tells you not to drink. You are pregnant, may be pregnant, or are planning to become pregnant. If you drink alcohol: Limit how much you have to 0-1 drink a day. Know how much alcohol is in your drink. In the [place] one drink equals one 12 oz bottle of beer (355 mL), one 5 oz glass of wine (148 mL), or one 1 oz glass of hard liquor (44 mL). Lifestyle Brush your teeth every morning and night with fluoride toothpaste. Floss one time each day. Exercise for at least 30 minutes 5 or more days each week. Do not use any products that contain nicotine or tobacco. These products include cigarettes, chewing tobacco, and vaping devices, such as e-cigarettes. If you need help quitting, ask your health care provider. Do not use drugs. If you are sexually active, practice safe sex. Use a condom or other form of protection to  prevent STIs. If you do not wish to become pregnant, use a form of birth control. If you plan to become pregnant, see your health care provider for a  prepregnancy visit. Take aspirin only as told by your health care provider. Make sure that you understand how much to take and what form to take. Work with your health care provider to find out whether it is safe and beneficial for you to take aspirin daily. Find healthy ways to manage stress, such as: Meditation, yoga, or listening to music. Journaling. Talking to a trusted person. Spending time with friends and family. Minimize exposure to UV radiation to reduce your risk of skin cancer. Safety Always wear your seat belt while driving or riding in a vehicle. Do not drive: If you have been drinking alcohol. Do not ride with someone who has been drinking. When you are tired or distracted. While texting. If you have been using any mind-altering substances or drugs. Wear a helmet and other protective equipment during sports activities. If you have firearms in your house, make sure you follow all gun safety procedures. Seek help if you have been physically or sexually abused. What's next? Visit your health care provider once a year for an annual wellness visit. Ask your health care provider how often you should have your eyes and teeth checked. Stay up to date on all vaccines. This information is not intended to replace advice given to you by your health care provider. Make sure you discuss any questions you have with your health care provider. Document Revised: 11/29/2020 Document Reviewed: 11/29/2020 Elsevier Patient Education  2024 Elsevier Inc.     Breast Self-Awareness Breast self-awareness means being familiar with how your breasts look and feel. It involves checking your breasts regularly and telling your health care provider about any changes. Practicing breast self-awareness helps to maintain breast health. Sometimes, changes are not harmful (are benign). Other times, a change in your breasts can be a sign of a serious medical problem. Being familiar with the look and feel of  your breasts can help you catch a breast problem while it is still small and can be treated. You should do breast self-exams even if you have breast implants. What you need: A mirror. A well-lit room. A pillow or other soft object. How to do a breast self-exam A breast self-exam is one way to learn what is normal for your breasts and whether your breasts are changing. To do a breast self-exam: Look for changes  Remove all the clothing above your waist. Stand in front of a mirror in a room with good lighting. Put your hands down at your sides. Compare your breasts in the mirror. Look for differences between them (asymmetry), such as: Differences in shape. Differences in size. Puckers, dips, and bumps in one breast and not the other. Look at each breast for changes in the skin, such as: Redness. Scaly areas. Skin thickening. Dimpling. Open sores (ulcers). Look for changes in your nipples, such as: Discharge. Bleeding. Dimpling. Redness. A nipple that looks pushed in (retracted), or that has changed position. Feel for changes Carefully feel your breasts for lumps and changes. It is best to do this self-exam while lying down. Follow these steps to feel each breast: Place a pillow under the shoulder of one side of your body. Place the arm of that side of your body behind your head. Feel the breast of that side of your body using the hand of the opposite arm. To do  this: Start in the nipple area and use the pads of your three middle fingers to make -inch (2 cm) overlapping circles. Use light, medium, and then firm pressure as you feel your breast, gently covering the entire breast area and armpit. Continue the overlapping circles, moving downward over the breast until you feel your ribs below your breast. Then, make circles with your fingers going upward until you reach your collarbone. Next, make circles by moving outward across your breast and into your armpit area. Squeeze the  nipple. Check for discharge and lumps. Repeat steps 1-7 to check your other breast. Sit or stand in the tub or shower. With soapy water on your skin, feel each breast the same way you did when you were lying down. Write down what you find Writing down what you find can help you remember what to discuss with your health care provider. Write down: What is normal for each breast. Any changes that you find in each breast. These include: The kind of changes you find. Any pain or tenderness. Size and location of any lumps. Where you are in your menstrual cycle, if you are still getting your menstrual period (menstruating). General tips If you are breastfeeding, the best time to examine your breasts is after a feeding or after using a breast pump. If you menstruate, the best time to examine your breasts is 5-7 days after your menstrual period. Breasts are generally lumpier during menstrual periods, and it may be more difficult to notice changes. With time and practice, you will become more familiar with the differences in your breasts and more comfortable with the exam. Contact a health care provider if: You see a change in the shape or size of your breasts or nipples. You see a change in the skin of your breast or nipples, such as a reddened or scaly area. You have unusual discharge from your nipples. You find a new lump or thick area. You have breast pain. You have any concerns about your breast health. Summary Breast self-awareness includes looking for physical changes in your breasts and feeling for any changes within your breasts. Breast self-awareness should be done in front of a mirror in a well-lit room. If you menstruate, the best time to examine your breasts is 5-7 days after your menstrual period. Tell your health care provider about any changes you notice in your breasts. Changes include changes in size, changes on the skin, pain or tenderness, or unusual fluid from your  nipples. This information is not intended to replace advice given to you by your health care provider. Make sure you discuss any questions you have with your health care provider. Document Revised: 11/08/2021 Document Reviewed: 04/05/2021 Elsevier Patient Education  2024 [employer].

## 2023-07-23 NOTE — Progress Notes (Signed)
 Patient presents for annual exam, no concerns today.   Desmond [place] LPN

## 2023-07-23 NOTE — Progress Notes (Signed)
 ANNUAL PREVENTATIVE CARE GYNECOLOGY  ENCOUNTER NOTE  Subjective:       Kathleen Dennis is a 58 y.o. [id number] female here for a routine annual gynecologic exam. The patient is sexually active. The patient is not taking hormone replacement therapy. Patient denies post-menopausal vaginal bleeding. The patient wears seatbelts: yes. The patient participates in regular exercise: yes. Has the patient ever been transfused or tattooed?: yes. The patient reports that there is not domestic violence in her life.  Current complaints: 1.  Paitent reports that she is going to [employer], has initiated Semaglutide  injections this past month but are very expensive ($75/week, and will increase with each dose). Wonders if her insurance will cover this medication. Currently has comorbidities of prediabetes, dyslipidemia and HTN. Has utilized other weight loss medications in the past such as Phentermine  (low doses due to her BPs) but typically reaches a plateau in weight loss quickly with use. Is still exercising regularly and modifying diet.    Gynecologic History Patient's last menstrual period was 06/17/2013 (approximate). Contraception: post menopausal status Last Pap: 04/23/2022. Results were: normal Last mammogram: 10/01/2022. Results were: normal Last Cologuard: 07/14/2020: Negative Last Dexa Scan: Never Done   Obstetric History OB History  Gravida Para Term Preterm AB Living  1    1   SAB IAB Ectopic Multiple Live Births  1        # Outcome Date GA Lbr Len/2nd Weight Sex Type Anes PTL Lv  1 SAB 1989            Past Medical History:  Diagnosis Date   ADHD (attention deficit hyperactivity disorder) 07/16/2016   Anxiety    Elevated cholesterol with high triglycerides 07/17/2017   GERD (gastroesophageal reflux disease) 07/16/2016   Hot flashes    Hypertension    since 2004 on meds since 2010    Family History  Problem Relation Age of Onset   Thyroid  disease Mother         hyperthyroidism   Diabetes Father    Hypertension Father    Hyperlipidemia Father    Heart disease Maternal Grandmother    Depression Maternal Grandmother    Heart disease Maternal Grandfather     Past Surgical History:  Procedure Laterality Date   AUGMENTATION MAMMAPLASTY Bilateral 1997   BREAST ENHANCEMENT SURGERY      Social History   Socioeconomic History   Marital status: Married    Spouse name: Not on file   Number of children: Not on file   Years of education: Not on file   Highest education level: Not on file  Occupational History   Not on file  Tobacco Use   Smoking status: Former    Current packs/day: 0.25    Average packs/day: 0.3 packs/day for 5.0 years (1.3 ttl pk-yrs)    Types: Cigarettes   Smokeless tobacco: Never  Vaping Use   Vaping status: Never Used  Substance and Sexual Activity   Alcohol use: Not Currently    Comment: socially   Drug use: No   Sexual activity: Yes    Birth control/protection: Post-menopausal  Other Topics Concern   Not on file  Social History Narrative   Married    Hospital Doctor to [employer]    No guns, wears selt belt, feels safe in relationship    Social Drivers of Health   Financial Resource Strain: Patient Declined (05/24/2023)   Received from [facility]   Overall Financial Resource Strain (CARDIA)  Difficulty of Paying Living Expenses: Patient declined  Food Insecurity: Patient Declined (05/24/2023)   Received from [facility]   Hunger Vital Sign    Worried About Running Out of Food in the Last Year: Patient declined    Ran Out of Food in the Last Year: Patient declined  Transportation Needs: Patient Declined (05/24/2023)   Received from [facility] - Transportation    In the past 12 months, has lack of transportation kept you from medical appointments or from getting medications?: Patient declined    Lack of Transportation (Non-Medical):  Patient declined  Physical Activity: Unknown (04/21/2017)   Exercise Vital Sign    Days of Exercise per Week: Patient declined    Minutes of Exercise per Session: Patient declined  Stress: No Stress Concern Present (04/21/2017)   [employer] of Occupational Health - Occupational Stress Questionnaire    Feeling of Stress : Not at all  Social Connections: Unknown (04/21/2017)   Social Connection and Isolation Panel [NHANES]    Frequency of Communication with Friends and Family: Patient declined    Frequency of Social Gatherings with Friends and Family: Patient declined    Attends Religious Services: Patient declined    Database Administrator or Organizations: Patient declined    Attends Banker Meetings: Patient declined    Marital Status: Patient declined  Intimate Partner Violence: Unknown (04/21/2017)   Humiliation, Afraid, Rape, and Kick questionnaire    Fear of Current or Ex-Partner: Patient declined    Emotionally Abused: Patient declined    Physically Abused: Patient declined    Sexually Abused: Patient declined    Current Outpatient Medications on File Prior to Visit  Medication Sig Dispense Refill   cetirizine (ZYRTEC) 10 MG tablet Take by mouth.     conjugated estrogens  (PREMARIN ) vaginal cream Place 1 Applicatorful vaginally daily. 42.5 g 12   conjugated estrogens  (PREMARIN ) vaginal cream Place 1 Applicatorful vaginally daily. 4 g 0   ezetimibe  (ZETIA ) 10 MG tablet TAKE 1 TABLET BY MOUTH DAILY.**NEED APPTFOR FUTHER REFILLS CALL THE OFFICE [phone] 90 tablet 3   hydrochlorothiazide  (HYDRODIURIL ) 25 MG tablet TAKE ONE TABLET BY MOUTH EVERY DAY 90 tablet 3   losartan  (COZAAR ) 100 MG tablet TAKE 1 TABLET BY MOUTH DAILY. 90 tablet 3   Multiple Vitamin (MULTI VITAMIN DAILY PO) Take by mouth.     omeprazole  (PRILOSEC) 40 MG capsule TAKE 1 CAPSULE BY MOUTH DAILY. 90 capsule 3   PARoxetine  (PAXIL ) 20 MG tablet TAKE 1 TABLET BY MOUTH EVERY DAY. 90 tablet 3    topiramate  (TOPAMAX ) 50 MG tablet TAKE ONE TABLET BY MOUTH TWICE DAILY 180 tablet 3   traZODone  (DESYREL ) 50 MG tablet Take 1 tablet (50 mg total) by mouth at bedtime as needed for sleep. 90 tablet 1   Triamcinolone  Acetonide (TRIAMCINOLONE  0.1 % CREAM : EUCERIN) CREA Apply 1 application topically 2 (two) times daily. (Patient taking differently: Apply 1 application  topically 2 (two) times daily as needed.) 1 each 0   No current facility-administered medications on file prior to visit.    Allergies  Allergen Reactions   Sulfa Antibiotics Hives      Review of Systems ROS Review of Systems - General ROS: negative for - chills, fatigue, fever, hot flashes, night sweats, weight gain or weight loss Psychological ROS: negative for - anxiety, decreased libido, depression, mood swings, physical abuse or sexual abuse Ophthalmic ROS: negative for - blurry vision, eye pain or  loss of vision ENT ROS: negative for - headaches, hearing change, visual changes or vocal changes Allergy and Immunology ROS: negative for - hives, itchy/watery eyes or seasonal allergies Hematological and Lymphatic ROS: negative for - bleeding problems, bruising, swollen lymph nodes or weight loss Endocrine ROS: negative for - galactorrhea, hair pattern changes, hot flashes, malaise/lethargy, mood swings, palpitations, polydipsia/polyuria, skin changes, temperature intolerance or unexpected weight changes Breast ROS: negative for - new or changing breast lumps or nipple discharge Respiratory ROS: negative for - cough or shortness of breath Cardiovascular ROS: negative for - chest pain, irregular heartbeat, palpitations or shortness of breath Gastrointestinal ROS: no abdominal pain, change in bowel habits, or black or bloody stools Genito-Urinary ROS: no dysuria, trouble voiding, or hematuria Musculoskeletal ROS: negative for - joint pain or joint stiffness Neurological ROS: negative for - bowel and bladder control  changes Dermatological ROS: negative for rash and skin lesion changes    Objective:   BP 120/78   Pulse 90   Ht 5' 5.5 (1.664 m)   Wt 156 lb 3.2 oz (70.9 kg)   LMP 06/17/2013 (Approximate)   BMI 25.60 kg/m  CONSTITUTIONAL: Well-developed, well-nourished female in no acute distress.  PSYCHIATRIC: Normal mood and affect. Normal behavior. Normal judgment and thought content. NEUROLGIC: Alert and oriented to person, place, and time. Normal muscle tone coordination. No cranial nerve deficit noted. HENT:  Normocephalic, atraumatic, External right and left ear normal. Oropharynx is clear and moist EYES: Conjunctivae and EOM are normal. Pupils are equal, round, and reactive to light. No scleral icterus.  NECK: Normal range of motion, supple, no masses.  Normal thyroid .  SKIN: Skin is warm and dry. No rash noted. Not diaphoretic. No erythema. No pallor. CARDIOVASCULAR: Normal heart rate noted, regular rhythm, no murmur. RESPIRATORY: Clear to auscultation bilaterally. Effort and breath sounds normal, no problems with respiration noted. BREASTS: Symmetric in size. No masses, skin changes, nipple drainage, or lymphadenopathy. Bilateral implants in situ.  ABDOMEN: Soft, normal bowel sounds, no distention noted.  No tenderness, rebound or guarding.  BLADDER: Normal PELVIC:  Bladder no bladder distension noted  Urethra: normal appearing urethra with no masses, tenderness or lesions  Vulva: normal appearing vulva with no masses, tenderness or lesions  Vagina: mildly atrophic appearing without discharge, no lesions  Cervix: normal appearing cervix without discharge or lesions  Uterus: uterus is normal size, shape, consistency and nontender  Adnexa: normal adnexa in size, nontender and no masses  RV: External Exam NormaI, No Rectal Masses, and Normal Sphincter tone  MUSCULOSKELETAL: Normal range of motion. No tenderness.  No cyanosis, clubbing, or edema.  2+ distal pulses. LYMPHATIC: No Axillary,  Supraclavicular, or Inguinal Adenopathy.   Labs: Lab Results  Component Value Date   WBC 8.8 04/23/2022   HGB 14.3 04/23/2022   HCT 41.9 04/23/2022   MCV 94 04/23/2022   PLT 219 04/23/2022    Lab Results  Component Value Date   CREATININE 0.82 04/23/2022   BUN 12 04/23/2022   NA 139 04/23/2022   K 3.5 04/23/2022   CL 98 04/23/2022   CO2 21 04/23/2022    Lab Results  Component Value Date   ALT 40 (H) 04/23/2022   AST 33 04/23/2022   ALKPHOS 108 04/23/2022   BILITOT 0.4 04/23/2022    Lab Results  Component Value Date   CHOL 244 (H) 04/23/2022   HDL 44 04/23/2022   LDLCALC 101 (H) 04/23/2022   LDLDIRECT 95.0 03/05/2018   TRIG 587 (HH) 04/23/2022  CHOLHDL 5.5 (H) 04/23/2022    Lab Results  Component Value Date   TSH 3.230 04/23/2022    Lab Results  Component Value Date   HGBA1C 6.0 (H) 04/23/2022     Assessment:   1. Encounter for well woman exam with routine gynecological exam   2. Vitamin D  deficiency   3. Hyperlipidemia, unspecified hyperlipidemia type   4. Prediabetes   5. Essential hypertension   6. Breast cancer screening by mammogram   7. Colon cancer screening   8. Overweight (BMI 25.0-29.9)   9. Gastroesophageal reflux disease without esophagitis      Plan:  - Pap:  up to date. Next due in November 2025. Can perform next year.  - Mammogram: Ordered - Colon Screening:  Ordered - Labs:  Pending - Routine preventative health maintenance measures emphasized:  Self Breast Exam and Exercise/Diet/Weight control - Flu vaccine status: Declined - Discussed weight loss, patient desires to see if insurance will cover. Advised that although she has comorbidities that qualify for use of this medication, may not be approved as she is almost at a normal BMI and likely may not approve. However patient still would like to try. Prescription sent for Wegovy .  - HTN managed with hydrochlorothiazide  and Cozaar .  - H/o Vitamin D  insufficiency, lab ordered.  -  Prediabetes attempting to manage with lifestyle changes, GLP-1.  - Hyperlipidemia, has prescription for Zetia , is not always scompliant with medication.  - Desires refill on omeprazole  for GERD, will  refill.   - Return to Clinic - 1 Year. Follow up in 1 month for weight check if medicatin approved.    Archie Savers, MD [facility]

## 2023-07-24 ENCOUNTER — Encounter: Payer: Self-pay | Admitting: Obstetrics and Gynecology

## 2023-07-24 ENCOUNTER — Ambulatory Visit (INDEPENDENT_AMBULATORY_CARE_PROVIDER_SITE_OTHER): Payer: No Typology Code available for payment source | Admitting: Obstetrics and Gynecology

## 2023-07-24 VITALS — BP 120/78 | HR 90 | Ht 65.5 in | Wt 156.2 lb

## 2023-07-24 DIAGNOSIS — Z1211 Encounter for screening for malignant neoplasm of colon: Secondary | ICD-10-CM

## 2023-07-24 DIAGNOSIS — I10 Essential (primary) hypertension: Secondary | ICD-10-CM

## 2023-07-24 DIAGNOSIS — K219 Gastro-esophageal reflux disease without esophagitis: Secondary | ICD-10-CM

## 2023-07-24 DIAGNOSIS — Z01419 Encounter for gynecological examination (general) (routine) without abnormal findings: Secondary | ICD-10-CM | POA: Diagnosis not present

## 2023-07-24 DIAGNOSIS — R7303 Prediabetes: Secondary | ICD-10-CM

## 2023-07-24 DIAGNOSIS — E663 Overweight: Secondary | ICD-10-CM

## 2023-07-24 DIAGNOSIS — Z1231 Encounter for screening mammogram for malignant neoplasm of breast: Secondary | ICD-10-CM

## 2023-07-24 DIAGNOSIS — E785 Hyperlipidemia, unspecified: Secondary | ICD-10-CM

## 2023-07-24 DIAGNOSIS — E559 Vitamin D deficiency, unspecified: Secondary | ICD-10-CM

## 2023-07-24 MED ORDER — EZETIMIBE 10 MG PO TABS: 10.0000 mg | ORAL_TABLET | Freq: Every day | ORAL | 3 refills | Status: DC

## 2023-07-24 MED ORDER — SEMAGLUTIDE-WEIGHT MANAGEMENT 0.25 MG/0.5ML ~~LOC~~ SOAJ: 0.2500 mg | SUBCUTANEOUS | 0 refills | Status: DC

## 2023-07-24 MED ORDER — OMEPRAZOLE 40 MG PO CPDR: 40.0000 mg | DELAYED_RELEASE_CAPSULE | Freq: Every day | ORAL | 3 refills | Status: DC

## 2023-07-25 ENCOUNTER — Other Ambulatory Visit: Payer: Self-pay | Admitting: Obstetrics and Gynecology

## 2023-07-25 ENCOUNTER — Encounter: Payer: Self-pay | Admitting: Obstetrics and Gynecology

## 2023-07-25 ENCOUNTER — Telehealth: Payer: Self-pay

## 2023-07-25 LAB — CBC
Hematocrit: 41.8 % (ref 34.0–46.6)
Hemoglobin: 14.1 g/dL (ref 11.1–15.9)
MCH: 33.8 pg — ABNORMAL HIGH (ref 26.6–33.0)
MCHC: 33.7 g/dL (ref 31.5–35.7)
MCV: 100 fL — ABNORMAL HIGH (ref 79–97)
Platelets: 219 10*3/uL (ref 150–450)
RBC: 4.17 x10E6/uL (ref 3.77–5.28)
RDW: 11.5 % — ABNORMAL LOW (ref 11.7–15.4)
WBC: 7.9 10*3/uL (ref 3.4–10.8)

## 2023-07-25 LAB — LIPID PANEL
Chol/HDL Ratio: 4.2 {ratio} (ref 0.0–4.4)
Cholesterol, Total: 221 mg/dL — ABNORMAL HIGH (ref 100–199)
HDL: 53 mg/dL (ref >39)
LDL Chol Calc (NIH): 100 mg/dL — ABNORMAL HIGH (ref 0–99)
Triglycerides: 403 mg/dL — ABNORMAL HIGH (ref 0–149)
VLDL Cholesterol Cal: 68 mg/dL — ABNORMAL HIGH (ref 5–40)

## 2023-07-25 LAB — COMPREHENSIVE METABOLIC PANEL
ALT: 67 [IU]/L — ABNORMAL HIGH (ref 0–32)
AST: 116 [IU]/L — ABNORMAL HIGH (ref 0–40)
Albumin: 4.6 g/dL (ref 3.8–4.9)
Alkaline Phosphatase: 94 [IU]/L (ref 44–121)
BUN/Creatinine Ratio: 18 (ref 9–23)
BUN: 14 mg/dL (ref 6–24)
Bilirubin Total: 0.2 mg/dL (ref 0.0–1.2)
CO2: 20 mmol/L (ref 20–29)
Calcium: 9.2 mg/dL (ref 8.7–10.2)
Chloride: 95 mmol/L — ABNORMAL LOW (ref 96–106)
Creatinine, Ser: 0.77 mg/dL (ref 0.57–1.00)
Globulin, Total: 2.5 g/dL (ref 1.5–4.5)
Glucose: 101 mg/dL — ABNORMAL HIGH (ref 70–99)
Potassium: 3.6 mmol/L (ref 3.5–5.2)
Sodium: 136 mmol/L (ref 134–144)
Total Protein: 7.1 g/dL (ref 6.0–8.5)
eGFR: 91 mL/min/{1.73_m2} (ref >59)

## 2023-07-25 LAB — VITAMIN D 25 HYDROXY (VIT D DEFICIENCY, FRACTURES): Vit D, 25-Hydroxy: 20.7 ng/mL — ABNORMAL LOW (ref 30.0–100.0)

## 2023-07-25 LAB — HEMOGLOBIN A1C
Est. average glucose Bld gHb Est-mCnc: 123 mg/dL
Hgb A1c MFr Bld: 5.9 % — ABNORMAL HIGH (ref 4.8–5.6)

## 2023-07-25 LAB — TSH: TSH: 1.89 u[IU]/mL (ref 0.450–4.500)

## 2023-07-25 MED ORDER — VITAMIN D (ERGOCALCIFEROL) 1.25 MG (50000 UNIT) PO CAPS: 50000.0000 [IU] | ORAL_CAPSULE | ORAL | 0 refills | Status: DC

## 2023-07-25 MED ORDER — ROSUVASTATIN CALCIUM 10 MG PO TABS: 10.0000 mg | ORAL_TABLET | Freq: Every day | ORAL | 3 refills | Status: DC

## 2023-07-28 ENCOUNTER — Other Ambulatory Visit: Payer: Self-pay

## 2023-07-29 NOTE — Telephone Encounter (Signed)
Attempted to initiate pre-authorization for medication and received this message below. KW  Your prior authorization was successfully sent to the prescriber on:  Thursday, February 6th at 03:02pm  You may close this window, view the request or your dashboard.

## 2023-07-30 ENCOUNTER — Other Ambulatory Visit: Payer: Self-pay

## 2023-07-30 MED ORDER — SEMAGLUTIDE-WEIGHT MANAGEMENT 0.25 MG/0.5ML ~~LOC~~ SOAJ
0.2500 mg | SUBCUTANEOUS | 0 refills | Status: DC
  Filled 2023-07-30 – 2023-08-19 (×3): qty 2, 28d supply, fill #0

## 2023-07-31 ENCOUNTER — Other Ambulatory Visit: Payer: Self-pay

## 2023-07-31 ENCOUNTER — Telehealth: Payer: Self-pay

## 2023-07-31 NOTE — Telephone Encounter (Signed)
Pt called and states the pharmacy needs a PA done for Semaglutide. Pt aware I will start on it now.

## 2023-08-01 ENCOUNTER — Other Ambulatory Visit: Payer: Self-pay

## 2023-08-19 ENCOUNTER — Other Ambulatory Visit: Payer: Self-pay

## 2023-08-20 ENCOUNTER — Other Ambulatory Visit: Payer: Self-pay | Admitting: Obstetrics and Gynecology

## 2023-08-22 NOTE — Telephone Encounter (Signed)
 Did they state why they denied it? If it is something we need to correct then let's work on that. If not, then see if patient can notify us if her insurance is willing to cover any other medications.  She does not need to keep the 3/11 appointment if she never initiated the [facility].

## 2023-08-22 NOTE — Telephone Encounter (Signed)
 Patient called.  Patient aware.

## 2023-08-26 ENCOUNTER — Other Ambulatory Visit: Payer: Self-pay | Admitting: Obstetrics and Gynecology

## 2023-08-26 ENCOUNTER — Ambulatory Visit: Payer: No Typology Code available for payment source | Admitting: Obstetrics and Gynecology

## 2023-08-26 NOTE — Telephone Encounter (Signed)
 TRIAGE VOICEMAIL: Patient states Wegovy/Zepbound needs prior authorization.

## 2023-08-27 NOTE — Telephone Encounter (Signed)
 Patient aware Kathleen Dennis was denied stating only covered with BMI >30 or BMI >27 with certain medical conditions. Her BMI is 25. Patient states she is prediabetic and that she has tried exercise and healthy diet. She is inquiring about Zepbound. Sending to Dr. Valentino Saxon for review.

## 2023-08-27 NOTE — Telephone Encounter (Signed)
 Marland Kitchen

## 2023-08-30 LAB — COLOGUARD: COLOGUARD: NEGATIVE

## 2023-09-04 NOTE — Telephone Encounter (Signed)
 I don't believe any of the injectable medications will be approved to continue at her current BMI. We could try some oral medications to continue to help maintain her prediabetes, and may continue to have some good benefits for maintaining weight as well.

## 2023-11-24 ENCOUNTER — Telehealth: Payer: Self-pay

## 2023-11-24 ENCOUNTER — Other Ambulatory Visit: Payer: Self-pay

## 2023-11-24 DIAGNOSIS — E559 Vitamin D deficiency, unspecified: Secondary | ICD-10-CM

## 2023-11-24 DIAGNOSIS — I10 Essential (primary) hypertension: Secondary | ICD-10-CM

## 2023-11-24 MED ORDER — LOSARTAN POTASSIUM 100 MG PO TABS: 100.0000 mg | ORAL_TABLET | Freq: Every day | ORAL | 0 refills | Status: DC

## 2023-11-24 MED ORDER — VITAMIN D (ERGOCALCIFEROL) 1.25 MG (50000 UNIT) PO CAPS
50000.0000 [IU] | ORAL_CAPSULE | ORAL | 1 refills | Status: AC
Start: 2023-11-24 — End: 2023-12-24

## 2023-11-24 NOTE — Telephone Encounter (Signed)
-----   Message from [facility] sent at 11/21/2023  5:20 PM EDT ----- Please inform pt that she will need her PCP to take over management of this medication. Send a 90-day refill as a courtesy until she can transfer this to her PCP, but this will be the last one. Thank you! ----- Message ----- From: Lendon Queen, CMA Sent: 11/21/2023   4:03 PM EDT To: Sofia Dunn, MD  Patient needs refill.

## 2024-01-30 ENCOUNTER — Other Ambulatory Visit: Payer: Self-pay

## 2024-01-30 DIAGNOSIS — E559 Vitamin D deficiency, unspecified: Secondary | ICD-10-CM

## 2024-01-30 MED ORDER — VITAMIN D (ERGOCALCIFEROL) 1.25 MG (50000 UNIT) PO CAPS: 50000.0000 [IU] | ORAL_CAPSULE | ORAL | 6 refills | Status: DC

## 2024-02-27 ENCOUNTER — Other Ambulatory Visit: Payer: Self-pay

## 2024-02-27 DIAGNOSIS — I10 Essential (primary) hypertension: Secondary | ICD-10-CM

## 2024-02-27 MED ORDER — HYDROCHLOROTHIAZIDE 25 MG PO TABS: 25.0000 mg | ORAL_TABLET | Freq: Every day | ORAL | 0 refills | Status: DC

## 2024-02-27 NOTE — Progress Notes (Signed)
 Pt is establishing care with a PCP in 2 weeks but will be out of the hydrochlorothiazide  prescribed by Dr. Connell last year.  One refill authorized and further refills will be obtained from her PCP.

## 2024-03-16 ENCOUNTER — Ambulatory Visit: Admitting: Family Medicine

## 2024-03-23 ENCOUNTER — Other Ambulatory Visit (HOSPITAL_COMMUNITY): Payer: Self-pay

## 2024-03-23 ENCOUNTER — Telehealth: Payer: Self-pay | Admitting: Pharmacy Technician

## 2024-03-23 ENCOUNTER — Encounter: Payer: Self-pay | Admitting: Nurse Practitioner

## 2024-03-23 ENCOUNTER — Ambulatory Visit: Admitting: Nurse Practitioner

## 2024-03-23 VITALS — BP 142/96 | HR 94 | Temp 98.3°F | Resp 16 | Ht 65.5 in | Wt 144.7 lb

## 2024-03-23 DIAGNOSIS — E782 Mixed hyperlipidemia: Secondary | ICD-10-CM

## 2024-03-23 DIAGNOSIS — Z148 Genetic carrier of other disease: Secondary | ICD-10-CM

## 2024-03-23 DIAGNOSIS — I10 Essential (primary) hypertension: Secondary | ICD-10-CM | POA: Diagnosis not present

## 2024-03-23 DIAGNOSIS — K219 Gastro-esophageal reflux disease without esophagitis: Secondary | ICD-10-CM | POA: Diagnosis not present

## 2024-03-23 DIAGNOSIS — Z1231 Encounter for screening mammogram for malignant neoplasm of breast: Secondary | ICD-10-CM | POA: Diagnosis not present

## 2024-03-23 DIAGNOSIS — Z114 Encounter for screening for human immunodeficiency virus [HIV]: Secondary | ICD-10-CM

## 2024-03-23 DIAGNOSIS — R946 Abnormal results of thyroid function studies: Secondary | ICD-10-CM | POA: Diagnosis not present

## 2024-03-23 DIAGNOSIS — E559 Vitamin D deficiency, unspecified: Secondary | ICD-10-CM

## 2024-03-23 DIAGNOSIS — R748 Abnormal levels of other serum enzymes: Secondary | ICD-10-CM

## 2024-03-23 DIAGNOSIS — Z8639 Personal history of other endocrine, nutritional and metabolic disease: Secondary | ICD-10-CM

## 2024-03-23 DIAGNOSIS — Z131 Encounter for screening for diabetes mellitus: Secondary | ICD-10-CM

## 2024-03-23 MED ORDER — HYDROCHLOROTHIAZIDE 25 MG PO TABS: 25.0000 mg | ORAL_TABLET | Freq: Every day | ORAL | 1 refills | Status: AC

## 2024-03-23 MED ORDER — LOSARTAN POTASSIUM 100 MG PO TABS: 100.0000 mg | ORAL_TABLET | Freq: Every day | ORAL | 1 refills | Status: AC

## 2024-03-23 MED ORDER — WEGOVY 1 MG/0.5ML ~~LOC~~ SOAJ: 1.0000 mg | SUBCUTANEOUS | 0 refills | Status: DC

## 2024-03-23 NOTE — Telephone Encounter (Signed)
 Pharmacy Patient Advocate Encounter  Received notification from [facility] that Prior Authorization for Wegovy  1MG /0.5ML auto-injectors has been APPROVED from 03/23/24 to 03/23/25. Ran test claim, Copay is $24.99. This test claim was processed through [facility]- copay amounts may vary at other pharmacies due to pharmacy/plan contracts, or as the patient moves through the different stages of their insurance plan.     PA #/Case ID/Reference #: [id number]

## 2024-03-23 NOTE — Progress Notes (Addendum)
 BP (!) 142/96   Pulse 94   Temp 98.3 F (36.8 C)   Resp 16   Ht 5' 5.5 (1.664 m)   Wt 144 lb 11.2 oz (65.6 kg)   LMP 06/17/2013 (Approximate)   SpO2 99%   BMI 23.71 kg/m    Subjective:    Patient ID: Kathleen Dennis, female    DOB: 1965/10/04, 58 y.o.   MRN: [id number]  HPI: Kathleen Dennis is a 58 y.o. female  Chief Complaint  Patient presents with   Establish Care   Hyperlipidemia   Hypertension   Depression   Weight Loss    Try to prescribe for mantinance dose for ins.   Discussed the use of AI scribe software for clinical note transcription with the patient, who gave verbal consent to proceed.  History of Present Illness Kathleen Dennis is a 58 year old female who presents to establish care.  Hypertension - Currently managed with losartan  100 mg daily and hydrochlorothiazide  25 mg daily - Blood pressure measured at 144/94 mmHg today - Blood pressure generally well controlled - No chest pain, shortness of breath, headaches, or blurred vision -did not take her blood pressure medication today  Hyperlipidemia and statin intolerance - On rosuvastatin  10 mg daily and Zetia  10 mg daily - Last lipid panel in February showed LDL of 100 mg/dL - Has not taken rosuvastatin  due to concerns about statin therapy  Weight management - Using semaglutide  for weight management - Started at a weight of 163 lbs - [facility] -has been working on weight loss for while.   - Encourage continuation of lifestyle modifications, including dietary management and regular exercise. -continue to increase physical activity, getting at least 150 min of physical activity a week.  Work on including [employer], broadcasting/film/video 2 days a week.  - continue eating at a calorie deficit 1600-1700 cal a day, eating a well balanced diet with whole foods, avoiding processed foods.   Patient is motivated to continue working on  lifestyle modification.    Gastroesophageal reflux disease and liver function abnormalities - History of GERD - Elevated liver enzymes - Past diagnosis of hemochromatosis - No prior liver ultrasound - Plans to have laboratory work completed today  Vitamin d  deficiency - History of vitamin D  deficiency - Not currently taking prescription vitamin D  - Occasionally takes an over-the-counter multivitamin  Preventive health maintenance - Overdue for mammogram, not performed in over a year  Depression - History of depression -currently on paxil  20 mg daily    Starting weight was 163 lbs    03/23/2024    8:11 AM 10/25/2020   11:58 AM 01/13/2019    9:16 AM  Depression screen PHQ 2/9  Decreased Interest 0 0 0  Down, Depressed, Hopeless 0 0 0  PHQ - 2 Score 0 0 0  Altered sleeping 0 1   Tired, decreased energy 0 1   Change in appetite 0 0   Feeling bad or failure about yourself  0 0   Trouble concentrating 0 0   Moving slowly or fidgety/restless 0 0   Suicidal thoughts 0 0   PHQ-9 Score 0 2   Difficult doing work/chores Not difficult at all Not difficult at all     Relevant past medical, surgical, family and social history reviewed and updated as indicated. Interim medical history since our last visit reviewed. Allergies and medications reviewed and updated.  Review of Systems  Per  HPI unless specifically indicated above     Objective:      BP (!) 142/96   Pulse 94   Temp 98.3 F (36.8 C)   Resp 16   Ht 5' 5.5 (1.664 m)   Wt 144 lb 11.2 oz (65.6 kg)   LMP 06/17/2013 (Approximate)   SpO2 99%   BMI 23.71 kg/m    Wt Readings from Last 3 Encounters:  03/23/24 144 lb 11.2 oz (65.6 kg)  07/24/23 156 lb 3.2 oz (70.9 kg)  04/23/22 164 lb 1.6 oz (74.4 kg)    Physical Exam VITALS: BP- 144/94 GENERAL: Alert, cooperative, well developed, no acute distress. HEENT: Normocephalic, normal oropharynx, moist mucous membranes. NECK: Supple, no abnormalities detected. CHEST:  Clear to auscultation bilaterally, no wheezes, rhonchi, or crackles. CARDIOVASCULAR: Normal heart rate and rhythm, S1 and S2 normal without murmurs. ABDOMEN: Soft, non-tender, non-distended, without organomegaly, normal bowel sounds. EXTREMITIES: No cyanosis or edema. NEUROLOGICAL: Cranial nerves grossly intact, moves all extremities without gross motor or sensory deficit.  Results for orders placed or performed in visit on 07/24/23  CBC   Collection Time: 07/24/23  9:56 AM  Result Value Ref Range   WBC 7.9 3.4 - 10.8 x10E3/uL   RBC 4.17 3.77 - 5.28 x10E6/uL   Hemoglobin 14.1 11.1 - 15.9 g/dL   Hematocrit 58.1 65.9 - 46.6 %   MCV 100 (H) 79 - 97 fL   MCH 33.8 (H) 26.6 - 33.0 pg   MCHC 33.7 31.5 - 35.7 g/dL   RDW 88.4 (L) 88.2 - 84.5 %   Platelets 219 150 - 450 x10E3/uL  Comprehensive metabolic panel   Collection Time: 07/24/23  9:56 AM  Result Value Ref Range   Glucose 101 (H) 70 - 99 mg/dL   BUN 14 6 - 24 mg/dL   Creatinine, Ser 9.22 0.57 - 1.00 mg/dL   eGFR 91 >40 fO/fpw/8.26   BUN/Creatinine Ratio 18 9 - 23   Sodium 136 134 - 144 mmol/L   Potassium 3.6 3.5 - 5.2 mmol/L   Chloride 95 (L) 96 - 106 mmol/L   CO2 20 20 - 29 mmol/L   Calcium  9.2 8.7 - 10.2 mg/dL   Total Protein 7.1 6.0 - 8.5 g/dL   Albumin 4.6 3.8 - 4.9 g/dL   Globulin, Total 2.5 1.5 - 4.5 g/dL   Bilirubin Total 0.2 0.0 - 1.2 mg/dL   Alkaline Phosphatase 94 44 - 121 IU/L   AST 116 (H) 0 - 40 IU/L   ALT 67 (H) 0 - 32 IU/L  Hemoglobin A1c   Collection Time: 07/24/23  9:56 AM  Result Value Ref Range   Hgb A1c MFr Bld 5.9 (H) 4.8 - 5.6 %   Est. average glucose Bld gHb Est-mCnc 123 mg/dL  Lipid panel   Collection Time: 07/24/23  9:56 AM  Result Value Ref Range   Cholesterol, Total 221 (H) 100 - 199 mg/dL   Triglycerides 596 (H) 0 - 149 mg/dL   HDL 53 >60 mg/dL   VLDL Cholesterol Cal 68 (H) 5 - 40 mg/dL   LDL Chol Calc (NIH) 899 (H) 0 - 99 mg/dL   Chol/HDL Ratio 4.2 0.0 - 4.4 ratio  TSH   Collection Time:  07/24/23  9:56 AM  Result Value Ref Range   TSH 1.890 0.450 - 4.500 uIU/mL  VITAMIN D  25 Hydroxy (Vit-D Deficiency, Fractures)   Collection Time: 07/24/23  9:56 AM  Result Value Ref Range   Vit D, 25-Hydroxy 20.7 (L) 30.0 -  100.0 ng/mL  Cologuard   Collection Time: 08/24/23 12:00 PM  Result Value Ref Range   COLOGUARD Negative Negative          Assessment & Plan:   Problem List Items Addressed This Visit       Cardiovascular and Mediastinum   Hypertension   Relevant Medications   hydrochlorothiazide  (HYDRODIURIL ) 25 MG tablet   losartan  (COZAAR ) 100 MG tablet     Digestive   GERD (gastroesophageal reflux disease)     Other   Hyperlipidemia   Relevant Medications   hydrochlorothiazide  (HYDRODIURIL ) 25 MG tablet   losartan  (COZAAR ) 100 MG tablet   Other Relevant Orders   Lipid panel   Elevated liver enzymes   Relevant Orders   Comprehensive metabolic panel with GFR   Hemochromatosis carrier   Relevant Orders   Iron, TIBC and Ferritin Panel   Abnormal thyroid  function test   Relevant Orders   TSH   Other Visit Diagnoses       Encounter for screening mammogram for malignant neoplasm of breast    -  Primary   Relevant Orders   MM 3D SCREENING MAMMOGRAM BILATERAL BREAST     Essential hypertension       Relevant Medications   hydrochlorothiazide  (HYDRODIURIL ) 25 MG tablet   losartan  (COZAAR ) 100 MG tablet   Other Relevant Orders   CBC with Differential/Platelet   Comprehensive metabolic panel with GFR     Screening for HIV without presence of risk factors       Relevant Orders   HIV Antibody (routine testing w rflx)     Vitamin D  deficiency       Relevant Orders   VITAMIN D  25 Hydroxy (Vit-D Deficiency, Fractures)     Screening for diabetes mellitus       Relevant Orders   Hemoglobin A1c     History of obesity       Relevant Medications   semaglutide -weight management (WEGOVY ) 1 MG/0.5ML SOAJ SQ injection        Assessment and Plan Assessment &  Plan Hypertension Blood pressure is elevated at 144/94 mmHg. Currently on losartan  and hydrochlorothiazide . No symptoms of chest pain, shortness of breath, headaches, or blurred vision. Possible anxiety-related elevation due to meeting a new provider. Also did not take blood pressure medication this morning.  - Recheck blood pressure after consultation  Hyperlipidemia Currently taking Zetia . Last lipid panel in February showed LDL at 100 mg/dL. Concerns about statin side effects, including muscle pain and liver issues, have prevented use of rosuvastatin . Zetia  may only reduce LDL by about 5 points. Discussed Repatha as an alternative, but insurance coverage is uncertain. - Discuss statin therapy and alternative options after lipid panel results  Elevated liver enzymes and hereditary hemochromatosis Elevated liver enzymes and hereditary hemochromatosis. No previous liver ultrasound. Plan to recheck liver function tests to assess current status. - Order liver function tests  Prediabetes Last A1c was 5.9%, indicating prediabetes. Discussed GLP-1 therapy (semaglutide ) for weight management. Insurance coverage for semaglutide  is uncertain due to BMI criteria. Intermittent use of semaglutide  due to supply issues. - Submit request for semaglutide  coverage to insurance  Abnormal thyroid  function tests Previously abnormal TSH levels, but follow-up tests were normal. Plan to recheck TSH to ensure thyroid  function is stable. - Order TSH test  Vitamin D  deficiency Vitamin D  deficiency. Not currently taking prescription vitamin D . Discussed over-the-counter multivitamin as a source of vitamin D . - Recommend over-the-counter multivitamin with vitamin D   General Health Maintenance  Overdue for mammogram. Discussed the importance of regular screenings. - Order mammogram  Weight management -has lost weight being on semaglutide  from [facility], currently on 1 mg dose weekly.  would like to see if she can  still maintain with small maintenance dose.   -she continues to work on lifestyle modification including low caloric diet and exercise      Follow up plan: Return in about 6 months (around 09/21/2024) for follow up.

## 2024-03-23 NOTE — Telephone Encounter (Signed)
 Pharmacy Patient Advocate Encounter  Received notification from [facility] that Prior Authorization for Wegovy  1mg /0.39ml has been APPROVED from 03/23/24 to 03/23/25   PA #/Case ID/Reference #: [id number]  Approval letter indexed to media tab

## 2024-03-23 NOTE — Telephone Encounter (Signed)
 Pharmacy Patient Advocate Encounter   Received notification from CoverMyMeds that prior authorization for Wegovy  1MG /0.5ML auto-injectors is required/requested.   Insurance verification completed.   The patient is insured through [facility].   Per test claim: PA required; PA submitted to above mentioned insurance via Latent Key/confirmation #/EOC [id number] Status is pending

## 2024-03-23 NOTE — Telephone Encounter (Signed)
 Good morning Kathleen Dennis, I need to submit a PA for Kathleen Dennis wegovy  prescription. Please include in your notes that you have emphasized life style modification, reduced calories and importance of exercising.  Thank you!!

## 2024-03-24 ENCOUNTER — Ambulatory Visit: Payer: Self-pay | Admitting: Nurse Practitioner

## 2024-03-24 DIAGNOSIS — Z148 Genetic carrier of other disease: Secondary | ICD-10-CM

## 2024-03-24 LAB — COMPREHENSIVE METABOLIC PANEL WITH GFR
AG Ratio: 1.8 (calc) (ref 1.0–2.5)
ALT: 29 U/L (ref 6–29)
AST: 40 U/L — ABNORMAL HIGH (ref 10–35)
Albumin: 4.6 g/dL (ref 3.6–5.1)
Alkaline phosphatase (APISO): 76 U/L (ref 37–153)
BUN: 9 mg/dL (ref 7–25)
CO2: 29 mmol/L (ref 20–32)
Calcium: 9.8 mg/dL (ref 8.6–10.4)
Chloride: 100 mmol/L (ref 98–110)
Creat: 0.62 mg/dL (ref 0.50–1.03)
Globulin: 2.6 g/dL (ref 1.9–3.7)
Glucose, Bld: 135 mg/dL — ABNORMAL HIGH (ref 65–99)
Potassium: 3.9 mmol/L (ref 3.5–5.3)
Sodium: 139 mmol/L (ref 135–146)
Total Bilirubin: 0.7 mg/dL (ref 0.2–1.2)
Total Protein: 7.2 g/dL (ref 6.1–8.1)
eGFR: 105 mL/min/1.73m2 (ref >=60)

## 2024-03-24 LAB — CBC WITH DIFFERENTIAL/PLATELET
Absolute Lymphocytes: 1924 {cells}/uL (ref 850–3900)
Absolute Monocytes: 481 {cells}/uL (ref 200–950)
Basophils Absolute: 39 {cells}/uL (ref 0–200)
Basophils Relative: 0.6 %
Eosinophils Absolute: 52 {cells}/uL (ref 15–500)
Eosinophils Relative: 0.8 %
HCT: 41.3 % (ref 35.0–45.0)
Hemoglobin: 14.1 g/dL (ref 11.7–15.5)
MCH: 34.2 pg — ABNORMAL HIGH (ref 27.0–33.0)
MCHC: 34.1 g/dL (ref 32.0–36.0)
MCV: 100.2 fL — ABNORMAL HIGH (ref 80.0–100.0)
MPV: 10.8 fL (ref 7.5–12.5)
Monocytes Relative: 7.4 %
Neutro Abs: 4004 {cells}/uL (ref 1500–7800)
Neutrophils Relative %: 61.6 %
Platelets: 200 Thousand/uL (ref 140–400)
RBC: 4.12 Million/uL (ref 3.80–5.10)
RDW: 12.1 % (ref 11.0–15.0)
Total Lymphocyte: 29.6 %
WBC: 6.5 Thousand/uL (ref 3.8–10.8)

## 2024-03-24 LAB — HIV ANTIBODY (ROUTINE TESTING W REFLEX)
HIV 1&2 Ab, 4th Generation: NONREACTIVE
HIV FINAL INTERPRETATION: NEGATIVE

## 2024-03-24 LAB — LIPID PANEL
Cholesterol: 248 mg/dL — ABNORMAL HIGH (ref <200)
HDL: 76 mg/dL (ref >=50)
LDL Cholesterol (Calc): 116 mg/dL — ABNORMAL HIGH
Non-HDL Cholesterol (Calc): 172 mg/dL — ABNORMAL HIGH (ref <130)
Total CHOL/HDL Ratio: 3.3 (calc) (ref <5.0)
Triglycerides: 395 mg/dL — ABNORMAL HIGH (ref <150)

## 2024-03-24 LAB — HEMOGLOBIN A1C
Hgb A1c MFr Bld: 5.3 % (ref <5.7)
Mean Plasma Glucose: 105 mg/dL
eAG (mmol/L): 5.8 mmol/L

## 2024-03-24 LAB — IRON,TIBC AND FERRITIN PANEL
%SAT: 54 % — ABNORMAL HIGH (ref 16–45)
Ferritin: 458 ng/mL — ABNORMAL HIGH (ref 16–232)
Iron: 160 ug/dL (ref 45–160)
TIBC: 299 ug/dL (ref 250–450)

## 2024-03-24 LAB — TSH: TSH: 4.47 m[IU]/L

## 2024-03-24 LAB — VITAMIN D 25 HYDROXY (VIT D DEFICIENCY, FRACTURES): Vit D, 25-Hydroxy: 42 ng/mL (ref 30–100)

## 2024-04-19 ENCOUNTER — Encounter: Payer: Self-pay | Admitting: Nurse Practitioner

## 2024-04-19 DIAGNOSIS — E785 Hyperlipidemia, unspecified: Secondary | ICD-10-CM

## 2024-04-20 ENCOUNTER — Other Ambulatory Visit: Payer: Self-pay | Admitting: Nurse Practitioner

## 2024-04-20 ENCOUNTER — Other Ambulatory Visit: Payer: Self-pay

## 2024-04-20 DIAGNOSIS — Z8639 Personal history of other endocrine, nutritional and metabolic disease: Secondary | ICD-10-CM

## 2024-04-20 MED ORDER — WEGOVY 0.5 MG/0.5ML ~~LOC~~ SOAJ
0.5000 mg | SUBCUTANEOUS | 0 refills | Status: DC
  Filled 2024-04-20 – 2024-04-30 (×2): qty 2, 28d supply, fill #0

## 2024-04-26 ENCOUNTER — Other Ambulatory Visit: Payer: Self-pay

## 2024-04-26 MED ORDER — OMEPRAZOLE 40 MG PO CPDR: 40.0000 mg | DELAYED_RELEASE_CAPSULE | Freq: Every day | ORAL | 3 refills | Status: AC

## 2024-04-29 MED ORDER — EZETIMIBE 10 MG PO TABS: 10.0000 mg | ORAL_TABLET | Freq: Every day | ORAL | 1 refills | Status: AC

## 2024-04-29 NOTE — Addendum Note (Signed)
 Addended by: YVONE WARREN BROCKS on: 04/29/2024 01:25 PM   Modules accepted: Orders

## 2024-04-30 ENCOUNTER — Other Ambulatory Visit: Payer: Self-pay

## 2024-05-21 ENCOUNTER — Other Ambulatory Visit: Payer: Self-pay | Admitting: Nurse Practitioner

## 2024-05-21 DIAGNOSIS — Z8639 Personal history of other endocrine, nutritional and metabolic disease: Secondary | ICD-10-CM

## 2024-05-21 MED ORDER — WEGOVY 1 MG/0.5ML ~~LOC~~ SOAJ: 1.0000 mg | SUBCUTANEOUS | 0 refills | Status: DC

## 2024-05-27 ENCOUNTER — Other Ambulatory Visit: Payer: Self-pay

## 2024-05-27 ENCOUNTER — Other Ambulatory Visit: Payer: Self-pay | Admitting: Nurse Practitioner

## 2024-05-27 DIAGNOSIS — Z8639 Personal history of other endocrine, nutritional and metabolic disease: Secondary | ICD-10-CM

## 2024-05-27 MED ORDER — WEGOVY 1 MG/0.5ML ~~LOC~~ SOAJ
1.0000 mg | SUBCUTANEOUS | 0 refills | Status: DC
  Filled 2024-05-27: qty 2, 28d supply, fill #0

## 2024-05-27 MED FILL — Semaglutide (Weight Mngmt) Soln Auto-Injector 1 MG/0.5ML: 1.0000 mg | SUBCUTANEOUS | 28 days supply | Qty: 2 | Fill #0 | Status: CN

## 2024-05-28 ENCOUNTER — Other Ambulatory Visit: Payer: Self-pay

## 2024-06-16 ENCOUNTER — Other Ambulatory Visit: Payer: Self-pay | Admitting: Nurse Practitioner

## 2024-06-16 DIAGNOSIS — I10 Essential (primary) hypertension: Secondary | ICD-10-CM

## 2024-06-17 NOTE — Telephone Encounter (Signed)
 Requested Prescriptions  Refused Prescriptions Disp Refills   hydrochlorothiazide  (HYDRODIURIL ) 25 MG tablet [Pharmacy Med Name: HYDROCHLOROTHIAZIDE  25 MG TAB] 90 tablet 1    Sig: TAKE 1 TABLET BY MOUTH ONCE DAILY     Cardiovascular: Diuretics - Thiazide Failed - 06/17/2024 11:46 AM      Failed - Last BP in normal range    BP Readings from Last 1 Encounters:  03/23/24 (!) 142/96         Passed - Cr in normal range and within 180 days    Creat  Date Value Ref Range Status  03/23/2024 0.62 0.50 - 1.03 mg/dL Final         Passed - K in normal range and within 180 days    Potassium  Date Value Ref Range Status  03/23/2024 3.9 3.5 - 5.3 mmol/L Final         Passed - Na in normal range and within 180 days    Sodium  Date Value Ref Range Status  03/23/2024 139 135 - 146 mmol/L Final  07/24/2023 136 134 - 144 mmol/L Final         Passed - Valid encounter within last 6 months    Recent Outpatient Visits           2 months ago Encounter for screening mammogram for malignant neoplasm of breast   [facility] Gareth Mliss [place], [place]

## 2024-06-28 ENCOUNTER — Other Ambulatory Visit: Payer: Self-pay

## 2024-06-28 ENCOUNTER — Encounter: Payer: Self-pay | Admitting: Nurse Practitioner

## 2024-06-28 DIAGNOSIS — Z8639 Personal history of other endocrine, nutritional and metabolic disease: Secondary | ICD-10-CM

## 2024-06-28 MED ORDER — WEGOVY 1 MG/0.5ML ~~LOC~~ SOAJ
1.0000 mg | SUBCUTANEOUS | 0 refills | Status: AC
  Filled 2024-06-28 (×2): qty 2, 28d supply, fill #0

## 2024-06-29 ENCOUNTER — Other Ambulatory Visit: Payer: Self-pay

## 2024-07-01 ENCOUNTER — Other Ambulatory Visit: Payer: Self-pay

## 2024-09-23 ENCOUNTER — Ambulatory Visit: Admitting: Nurse Practitioner
